# Patient Record
Sex: Female | Born: 1950
Health system: Southern US, Community
[De-identification: ages and names within clinical notes are randomized; demographics above are authoritative.]

## PROBLEM LIST (undated history)

## (undated) DIAGNOSIS — I10 Essential (primary) hypertension: Secondary | ICD-10-CM

## (undated) DIAGNOSIS — M109 Gout, unspecified: Secondary | ICD-10-CM

## (undated) DIAGNOSIS — E785 Hyperlipidemia, unspecified: Secondary | ICD-10-CM

## (undated) DIAGNOSIS — K219 Gastro-esophageal reflux disease without esophagitis: Secondary | ICD-10-CM

## (undated) DIAGNOSIS — J309 Allergic rhinitis, unspecified: Secondary | ICD-10-CM

## (undated) DIAGNOSIS — M1990 Unspecified osteoarthritis, unspecified site: Secondary | ICD-10-CM

## (undated) HISTORY — PX: VAGINAL HYSTERECTOMY: SUR661

## (undated) HISTORY — PX: KNEE SURGERY: SHX244

## (undated) HISTORY — DX: Allergic rhinitis, unspecified: J30.9

## (undated) HISTORY — DX: Hyperlipidemia, unspecified: E78.5

## (undated) HISTORY — DX: Gastro-esophageal reflux disease without esophagitis: K21.9

## (undated) HISTORY — DX: Unspecified osteoarthritis, unspecified site: M19.90

## (undated) HISTORY — DX: Gout, unspecified: M10.9

---

## 1999-06-10 ENCOUNTER — Encounter: Admission: RE | Admit: 1999-06-10 | Discharge: 1999-06-10 | Payer: Self-pay

## 1999-06-10 ENCOUNTER — Encounter: Payer: Self-pay | Admitting: Family Medicine

## 1999-08-27 ENCOUNTER — Ambulatory Visit (HOSPITAL_BASED_OUTPATIENT_CLINIC_OR_DEPARTMENT_OTHER): Admission: RE | Admit: 1999-08-27 | Discharge: 1999-08-27 | Payer: Self-pay | Admitting: Orthopedic Surgery

## 2000-12-22 ENCOUNTER — Other Ambulatory Visit: Admission: RE | Admit: 2000-12-22 | Discharge: 2000-12-22 | Payer: Self-pay | Admitting: Obstetrics and Gynecology

## 2001-06-06 ENCOUNTER — Inpatient Hospital Stay (HOSPITAL_COMMUNITY): Admission: RE | Admit: 2001-06-06 | Discharge: 2001-06-08 | Payer: Self-pay | Admitting: Obstetrics and Gynecology

## 2001-06-07 ENCOUNTER — Encounter: Payer: Self-pay | Admitting: Obstetrics and Gynecology

## 2002-01-07 ENCOUNTER — Encounter (INDEPENDENT_AMBULATORY_CARE_PROVIDER_SITE_OTHER): Payer: Self-pay | Admitting: Internal Medicine

## 2002-01-07 LAB — CONVERTED CEMR LAB

## 2004-02-06 ENCOUNTER — Other Ambulatory Visit: Admission: RE | Admit: 2004-02-06 | Discharge: 2004-02-06 | Payer: Self-pay | Admitting: Obstetrics and Gynecology

## 2004-05-11 ENCOUNTER — Ambulatory Visit: Payer: Self-pay | Admitting: Internal Medicine

## 2004-11-14 ENCOUNTER — Emergency Department: Payer: Self-pay | Admitting: Emergency Medicine

## 2004-12-13 ENCOUNTER — Ambulatory Visit: Payer: Self-pay | Admitting: Family Medicine

## 2005-03-07 ENCOUNTER — Ambulatory Visit: Payer: Self-pay | Admitting: Family Medicine

## 2005-04-06 ENCOUNTER — Ambulatory Visit: Payer: Self-pay | Admitting: Family Medicine

## 2006-01-18 ENCOUNTER — Ambulatory Visit: Payer: Self-pay | Admitting: Family Medicine

## 2006-01-26 ENCOUNTER — Ambulatory Visit: Payer: Self-pay | Admitting: Family Medicine

## 2006-02-02 ENCOUNTER — Ambulatory Visit: Payer: Self-pay | Admitting: Family Medicine

## 2006-12-04 ENCOUNTER — Ambulatory Visit: Payer: Self-pay | Admitting: Family Medicine

## 2006-12-04 DIAGNOSIS — I10 Essential (primary) hypertension: Secondary | ICD-10-CM | POA: Insufficient documentation

## 2006-12-15 ENCOUNTER — Encounter (INDEPENDENT_AMBULATORY_CARE_PROVIDER_SITE_OTHER): Payer: Self-pay | Admitting: Internal Medicine

## 2006-12-15 DIAGNOSIS — E7800 Pure hypercholesterolemia, unspecified: Secondary | ICD-10-CM | POA: Insufficient documentation

## 2007-01-01 ENCOUNTER — Ambulatory Visit: Payer: Self-pay | Admitting: Family Medicine

## 2007-01-01 DIAGNOSIS — J309 Allergic rhinitis, unspecified: Secondary | ICD-10-CM | POA: Insufficient documentation

## 2007-02-13 ENCOUNTER — Telehealth (INDEPENDENT_AMBULATORY_CARE_PROVIDER_SITE_OTHER): Payer: Self-pay | Admitting: *Deleted

## 2007-04-02 ENCOUNTER — Ambulatory Visit: Payer: Self-pay | Admitting: Family Medicine

## 2007-04-02 DIAGNOSIS — K209 Esophagitis, unspecified without bleeding: Secondary | ICD-10-CM | POA: Insufficient documentation

## 2007-04-03 LAB — CONVERTED CEMR LAB
ALT: 24 units/L (ref 0–35)
AST: 19 units/L (ref 0–37)
Albumin: 4.2 g/dL (ref 3.5–5.2)
Alkaline Phosphatase: 80 units/L (ref 39–117)
BUN: 14 mg/dL (ref 6–23)
Basophils Absolute: 0 10*3/uL (ref 0.0–0.1)
Basophils Relative: 0.4 % (ref 0.0–1.0)
Bilirubin, Direct: 0.1 mg/dL (ref 0.0–0.3)
CO2: 29 meq/L (ref 19–32)
Calcium: 9.8 mg/dL (ref 8.4–10.5)
Chloride: 101 meq/L (ref 96–112)
Cholesterol: 232 mg/dL (ref 0–200)
Creatinine, Ser: 0.8 mg/dL (ref 0.4–1.2)
Direct LDL: 158.3 mg/dL
Eosinophils Absolute: 0.1 10*3/uL (ref 0.0–0.6)
Eosinophils Relative: 1 % (ref 0.0–5.0)
GFR calc Af Amer: 96 mL/min
GFR calc non Af Amer: 79 mL/min
Glucose, Bld: 103 mg/dL — ABNORMAL HIGH (ref 70–99)
HCT: 40.1 % (ref 36.0–46.0)
HDL: 52.2 mg/dL (ref >39.0)
Hemoglobin: 13.4 g/dL (ref 12.0–15.0)
Lymphocytes Relative: 28.8 % (ref 12.0–46.0)
MCHC: 33.5 g/dL (ref 30.0–36.0)
MCV: 86.6 fL (ref 78.0–100.0)
Monocytes Absolute: 0.4 10*3/uL (ref 0.2–0.7)
Monocytes Relative: 6.2 % (ref 3.0–11.0)
Neutro Abs: 3.8 10*3/uL (ref 1.4–7.7)
Neutrophils Relative %: 63.6 % (ref 43.0–77.0)
Platelets: 318 10*3/uL (ref 150–400)
Potassium: 3.9 meq/L (ref 3.5–5.1)
RBC: 4.63 M/uL (ref 3.87–5.11)
RDW: 11.7 % (ref 11.5–14.6)
Sodium: 139 meq/L (ref 135–145)
TSH: 1.6 microintl units/mL (ref 0.35–5.50)
Total Bilirubin: 0.7 mg/dL (ref 0.3–1.2)
Total CHOL/HDL Ratio: 4.4
Total Protein: 7.7 g/dL (ref 6.0–8.3)
Triglycerides: 111 mg/dL (ref 0–149)
VLDL: 22 mg/dL (ref 0–40)
WBC: 6 10*3/uL (ref 4.5–10.5)

## 2007-04-11 ENCOUNTER — Telehealth (INDEPENDENT_AMBULATORY_CARE_PROVIDER_SITE_OTHER): Payer: Self-pay | Admitting: Internal Medicine

## 2007-05-29 ENCOUNTER — Ambulatory Visit: Payer: Self-pay | Admitting: Internal Medicine

## 2007-06-01 LAB — CONVERTED CEMR LAB
ALT: 30 units/L (ref 0–35)
AST: 25 units/L (ref 0–37)
Albumin: 4 g/dL (ref 3.5–5.2)
Alkaline Phosphatase: 78 units/L (ref 39–117)
Bilirubin, Direct: 0.1 mg/dL (ref 0.0–0.3)
Cholesterol: 155 mg/dL (ref 0–200)
HDL: 51.4 mg/dL (ref >39.0)
LDL Cholesterol: 82 mg/dL (ref 0–99)
Total Bilirubin: 0.8 mg/dL (ref 0.3–1.2)
Total CHOL/HDL Ratio: 3
Total Protein: 7.5 g/dL (ref 6.0–8.3)
Triglycerides: 110 mg/dL (ref 0–149)
VLDL: 22 mg/dL (ref 0–40)

## 2007-07-03 ENCOUNTER — Encounter (INDEPENDENT_AMBULATORY_CARE_PROVIDER_SITE_OTHER): Payer: Self-pay | Admitting: *Deleted

## 2007-11-28 ENCOUNTER — Ambulatory Visit: Payer: Self-pay | Admitting: Family Medicine

## 2007-11-30 LAB — CONVERTED CEMR LAB
ALT: 20 units/L (ref 0–35)
AST: 15 units/L (ref 0–37)
Cholesterol: 138 mg/dL (ref 0–200)
HDL: 46.7 mg/dL (ref >39.0)
LDL Cholesterol: 72 mg/dL (ref 0–99)
Total CHOL/HDL Ratio: 3
Triglycerides: 98 mg/dL (ref 0–149)
VLDL: 20 mg/dL (ref 0–40)

## 2007-12-19 ENCOUNTER — Ambulatory Visit: Payer: Self-pay | Admitting: Family Medicine

## 2007-12-19 DIAGNOSIS — E669 Obesity, unspecified: Secondary | ICD-10-CM | POA: Insufficient documentation

## 2007-12-19 DIAGNOSIS — K2100 Gastro-esophageal reflux disease with esophagitis, without bleeding: Secondary | ICD-10-CM | POA: Insufficient documentation

## 2007-12-19 DIAGNOSIS — R229 Localized swelling, mass and lump, unspecified: Secondary | ICD-10-CM | POA: Insufficient documentation

## 2008-06-17 ENCOUNTER — Telehealth (INDEPENDENT_AMBULATORY_CARE_PROVIDER_SITE_OTHER): Payer: Self-pay | Admitting: Internal Medicine

## 2008-06-20 ENCOUNTER — Ambulatory Visit: Payer: Self-pay | Admitting: Internal Medicine

## 2008-06-23 LAB — CONVERTED CEMR LAB
ALT: 22 units/L (ref 0–35)
AST: 18 units/L (ref 0–37)
BUN: 13 mg/dL (ref 6–23)
CO2: 32 meq/L (ref 19–32)
Calcium: 9.4 mg/dL (ref 8.4–10.5)
Chloride: 107 meq/L (ref 96–112)
Cholesterol: 137 mg/dL (ref 0–200)
Creatinine, Ser: 0.8 mg/dL (ref 0.4–1.2)
GFR calc non Af Amer: 78.55 mL/min (ref >60)
Glucose, Bld: 96 mg/dL (ref 70–99)
HDL: 46.9 mg/dL (ref >39.00)
LDL Cholesterol: 70 mg/dL (ref 0–99)
Potassium: 3.7 meq/L (ref 3.5–5.1)
Sodium: 143 meq/L (ref 135–145)
Total CHOL/HDL Ratio: 3
Triglycerides: 100 mg/dL (ref 0.0–149.0)
VLDL: 20 mg/dL (ref 0.0–40.0)

## 2008-06-24 ENCOUNTER — Encounter (INDEPENDENT_AMBULATORY_CARE_PROVIDER_SITE_OTHER): Payer: Self-pay | Admitting: Internal Medicine

## 2008-06-24 ENCOUNTER — Ambulatory Visit: Payer: Self-pay | Admitting: Family Medicine

## 2008-06-24 DIAGNOSIS — R079 Chest pain, unspecified: Secondary | ICD-10-CM | POA: Insufficient documentation

## 2008-07-29 ENCOUNTER — Ambulatory Visit: Payer: Self-pay | Admitting: Family Medicine

## 2008-07-29 DIAGNOSIS — K219 Gastro-esophageal reflux disease without esophagitis: Secondary | ICD-10-CM | POA: Insufficient documentation

## 2008-12-22 ENCOUNTER — Ambulatory Visit: Payer: Self-pay | Admitting: Family Medicine

## 2008-12-23 LAB — CONVERTED CEMR LAB
ALT: 21 units/L (ref 0–35)
AST: 19 units/L (ref 0–37)
Cholesterol: 155 mg/dL (ref 0–200)
HDL: 48.9 mg/dL (ref >39.00)
LDL Cholesterol: 84 mg/dL (ref 0–99)
Total CHOL/HDL Ratio: 3
Triglycerides: 110 mg/dL (ref 0.0–149.0)
VLDL: 22 mg/dL (ref 0.0–40.0)

## 2009-01-27 ENCOUNTER — Ambulatory Visit: Payer: Self-pay | Admitting: Family Medicine

## 2009-01-27 DIAGNOSIS — F4321 Adjustment disorder with depressed mood: Secondary | ICD-10-CM | POA: Insufficient documentation

## 2009-06-23 ENCOUNTER — Ambulatory Visit: Payer: Self-pay | Admitting: Family Medicine

## 2009-06-24 LAB — CONVERTED CEMR LAB
ALT: 19 units/L (ref 0–35)
AST: 18 units/L (ref 0–37)
BUN: 14 mg/dL (ref 6–23)
CO2: 30 meq/L (ref 19–32)
Calcium: 9.4 mg/dL (ref 8.4–10.5)
Chloride: 104 meq/L (ref 96–112)
Cholesterol: 148 mg/dL (ref 0–200)
Creatinine, Ser: 0.7 mg/dL (ref 0.4–1.2)
GFR calc non Af Amer: 89.83 mL/min (ref >60)
Glucose, Bld: 92 mg/dL (ref 70–99)
HDL: 51.4 mg/dL (ref >39.00)
LDL Cholesterol: 74 mg/dL (ref 0–99)
Potassium: 4 meq/L (ref 3.5–5.1)
Sodium: 142 meq/L (ref 135–145)
TSH: 1.64 microintl units/mL (ref 0.35–5.50)
Total CHOL/HDL Ratio: 3
Triglycerides: 114 mg/dL (ref 0.0–149.0)
VLDL: 22.8 mg/dL (ref 0.0–40.0)

## 2009-07-21 ENCOUNTER — Ambulatory Visit: Payer: Self-pay | Admitting: Family Medicine

## 2010-03-09 NOTE — Assessment & Plan Note (Signed)
Summary: RE-EST Crystal [facility] FROM [facility]   Vital Signs:  Patient profile:   60 year old female Height:      64.25 inches Weight:      204.50 pounds BMI:     34.96 Temp:     98.2 degrees F oral Pulse rate:   68 / minute Pulse rhythm:   regular BP sitting:   112 / 74  (left arm) Cuff size:   large  Vitals Entered By: Linde Gillis CMA Duncan Dull) (July 21, 2009 8:11 AM) CC: establish care, Crystal [facility]in patient   History of Present Illness: 60 yo new to me here to discuss anxiety.  Her mom has recently become acutely ill.  Has rapidly progressing dementia, recently diagnosed.  Currently in a rehab facility, they are looking for permanent nursing home placement. Feels like she is already grieving the death of her mother eventhough she is still here.  She looks in her eyes and no longer sees her mom. She is having difficultly sleeping, difficulty concentrating.  Last week, she had a panic attack. Worried about her mom, hopes that her mom is not scared. No SI or HI. Has a great support system in her daughter. Her brother and sister have not been too helpful.  Current Medications (verified): 1)  Allegra 180 Mg  Tabs (Fexofenadine Hcl) .... Take 1 Tablet By Mouth Once A Day 2)  Norvasc 5 Mg  Tabs (Amlodipine Besylate) .Marland Kitchen.. 1 Once Daily For Bp 3)  Singulair 10 Mg  Tabs (Montelukast Sodium) .Marland Kitchen.. 1qd As Needed Congeation 4)  Lipitor 20 Mg  Tabs (Atorvastatin Calcium) .Marland Kitchen.. 1 Once Daily For Cholesterol By Mouth 5)  Nexium 40 Mg Cpdr (Esomeprazole Magnesium) .Marland Kitchen.. 1 Each Morning Before Other Fluids or Food, Must Eat or Drink 30-60 Min After Taking 6)  Alprazolam 0.25 Mg Tabs (Alprazolam) .Marland Kitchen.. 1 Tab Three Dailly For Anxiety  Allergies (verified): No Known Drug Allergies  Review of Systems      See HPI Psych:  Complains of anxiety, depression, easily tearful, and panic attacks; denies easily angered, mental problems, sense of great danger, suicidal thoughts/plans, thoughts of violence, unusual  visions or sounds, and thoughts /plans of harming others.  Physical Exam  General:  alert, well-developed, well-nourished, and well-hydrated.  Psych:  normally interactive, flat affect, and subdued.   tearful at times, appropriate.   Impression & Recommendations:  Problem # 1:  DEPRESSION, SITUATIONAL (ICD-309.0) Assessment New Time spent with patient 25 minutes, more than 50% of this time was spent counseling patient on her acute grief reaction.   Feelings are appropriate.  Will give short course of benzo to help with sleep and panic attacks. Also discussed importance of psychotherapy.  Given Dr. Veatrice Kells name.  Complete Medication List: 1)  Allegra 180 Mg Tabs (Fexofenadine hcl) .... Take 1 tablet by mouth once a day 2)  Norvasc 5 Mg Tabs (Amlodipine besylate) .Marland Kitchen.. 1 once daily for bp 3)  Singulair 10 Mg Tabs (Montelukast sodium) .Marland Kitchen.. 1qd as needed congeation 4)  Lipitor 20 Mg Tabs (Atorvastatin calcium) .Marland Kitchen.. 1 once daily for cholesterol by mouth 5)  Nexium 40 Mg Cpdr (Esomeprazole magnesium) .Marland Kitchen.. 1 each morning before other fluids or food, must eat or drink 30-60 min after taking 6)  Alprazolam 0.25 Mg Tabs (Alprazolam) .Marland Kitchen.. 1 tab three dailly for anxiety  Patient Instructions: 1)  It was so nice to meet you, [name]. 2)  Our psychologist's name is Dr. Sherrill Raring. Prescriptions: NEXIUM 40 MG CPDR (ESOMEPRAZOLE  MAGNESIUM) 1 each morning before other fluids or food, must eat or drink 30-60 min after taking  #90 x 3   Entered and Authorized by:   Ruthe Mannan MD   Signed by:   Ruthe Mannan MD on 07/21/2009   Method used:   Electronically to        [facility] [phone]* (retail)       [address] [place], [place]  [address]       Ph: [phone] or [phone]       Fax: [phone]   RxID:   [id number] LIPITOR 20 MG  TABS (ATORVASTATIN CALCIUM) 1 once daily for cholesterol by mouth  #90 x 4   Entered and Authorized by:   Ruthe Mannan  MD   Signed by:   Ruthe Mannan MD on 07/21/2009   Method used:   Electronically to        [facility] [phone]* (retail)       [address] [place], [place]  [address]       Ph: [phone] or [phone]       Fax: [phone]   RxID:   [id number] ALPRAZOLAM 0.25 MG TABS (ALPRAZOLAM) 1 tab three dailly for anxiety  #60 x 0   Entered and Authorized by:   Ruthe Mannan MD   Signed by:   Ruthe Mannan MD on 07/21/2009   Method used:   Print then Give to Patient   RxID:   [id number]   Current Allergies (reviewed today): No known allergies

## 2010-06-23 ENCOUNTER — Encounter: Payer: Self-pay | Admitting: Family Medicine

## 2010-06-23 LAB — HM MAMMOGRAPHY

## 2010-06-24 ENCOUNTER — Encounter: Payer: Self-pay | Admitting: Family Medicine

## 2010-06-24 ENCOUNTER — Ambulatory Visit (INDEPENDENT_AMBULATORY_CARE_PROVIDER_SITE_OTHER): Payer: BC Managed Care – PPO | Admitting: Family Medicine

## 2010-06-24 VITALS — BP 120/80 | HR 88 | Temp 97.8°F | Ht 64.0 in | Wt 207.1 lb

## 2010-06-24 DIAGNOSIS — R42 Dizziness and giddiness: Secondary | ICD-10-CM

## 2010-06-24 MED ORDER — ATORVASTATIN CALCIUM 20 MG PO TABS: 20.0000 mg | ORAL_TABLET | Freq: Every day | ORAL | Status: DC

## 2010-06-24 MED ORDER — MONTELUKAST SODIUM 10 MG PO TABS: 10.0000 mg | ORAL_TABLET | Freq: Every day | ORAL | Status: DC

## 2010-06-24 MED ORDER — MECLIZINE HCL 12.5 MG PO TABS: 12.5000 mg | ORAL_TABLET | Freq: Three times a day (TID) | ORAL | Status: DC | PRN

## 2010-06-24 MED ORDER — AMLODIPINE BESYLATE 5 MG PO TABS: 5.0000 mg | ORAL_TABLET | Freq: Every day | ORAL | Status: DC

## 2010-06-24 MED ORDER — ESOMEPRAZOLE MAGNESIUM 20 MG PO CPDR: 20.0000 mg | DELAYED_RELEASE_CAPSULE | Freq: Every day | ORAL | Status: DC

## 2010-06-24 NOTE — Progress Notes (Signed)
Subjective:     Crystal Hardin is a 60 y.o. female who presents for evaluation of dizziness. The symptoms started 2 days ago and are improved. The attacks occur intermittently and last 2 minutes. Positions that worsen symptoms: rolling in bed to the right and standing up. Previous workup/treatments: none. Associated ear symptoms: none  Associated CNS symptoms: none. Recent infections: none. Head trauma: denied. Drug ingestion: none. Noise exposure: no occupational exposure. Family history: non-contributory.  The PMH, PSH, Social History, Family History, Medications, and allergies have been reviewed in [facility], and have been updated if relevant.   Review of Systems Pertinent items are noted in HPI.    Objective:    BP 120/80  Pulse 88  Temp(Src) 97.8 F (36.6 C) (Oral)  Ht 5\' 4"  (1.626 m)  Wt 207 lb 1.9 oz (93.949 kg)  BMI 35.55 kg/m2  General Appearance:    Alert, cooperative, no distress, appears stated age  Head:    Normocephalic, without obvious abnormality, atraumatic  Eyes:    PERRL, conjunctiva/corneas clear, EOM's intact, fundi    benign, both eyes  Ears:    Normal TM's and external ear canals, both ears  Neurologic:   CNII-XII intact, normal strength, sensation and reflexes    throughout        Assessment:    Benign positional vertigo    Plan:    Meclizine per medication orders.

## 2010-06-25 NOTE — Op Note (Signed)
[facility]  Patient:    Crystal Hardin, Crystal Hardin                       MRN: [id number] Proc. Date: 08/27/99 Adm. Date:  [id number] Disc. Date: [id number] Attending:  Milly Jakob                           Operative Report  PREOPERATIVE DIAGNOSIS:  Medial meniscal tear.  POSTOPERATIVE DIAGNOSES:  Medial meniscal tear.  Medial femoral condylar chondromalacia, grades 2 and 3.  Medial patella facet chondromalacia, grade 2. Probable old anterior cruciate ligament tear.  PROCEDURES PERFORMED:  Partial medial meniscectomy.  Debridement of medial femoral condyle.  Debridement of medial patella facet.  BRIEF HISTORY:  This is a 59 year old female with a long history of having knee pain.  She has been having catching and locking of the knee.  She occasionally had to stand and kind of rotate the knee to get it to unlock. Because of the continued pain she was brought to the operating room for evaluation under anesthesia and arthroscopy.  PROCEDURE IN DETAIL:  The patient was brought to the operating room and under general anesthesia.   She was note to have no significant instability.  She had a good end point to her Lachman, and no significant pivot shift.  She was then prepped and draped in the usual sterile fashion.  Following this, routine arthroscopic examination of the knee revealed there was an obvious medial meniscal tear.  This was debrided with a combination of straight biting forceps and the remainder with the reflexion shaver.  The patient also noted to have fairly significant chondromalacia grades 2-3 of the medial femoral condyle.  This was debrided with the flexion shaver down to a smooth and stable rim.  At this point attention was turned to the anterior cruciate which, in extension, looked great.  In flexion the anterior cruciate did appear to lie down, while lateral did appear to be a little bit empty.  The fibers were loose in flexion, and in  extension they did tighten up and seemed to be functioning reasonably well.  There was a fair amount of synovitis which was debrided to get a good look at the lateral side.  Attention was then turned to the patellofemoral joint and was noted to be fairly significant chondromalacia on the medial patella facet.  This was debrided with the the flexion shaver.   Attention was turned back down into the medial side where a final check was made for any recent fragmented pieces, and it was noted to be normal.  At that point, the knee was copiously irrigated, suctioned dry, and the portals closed with Steri-Strips, and a sterile dressing was applied.  The patient was taken to the recovery room and noted to be in satisfactory condition.  Estimated blood loss for the procedure was minimal. DD:  08/27/99 TD:  08/29/99 Job: [id number]

## 2010-06-25 NOTE — Op Note (Signed)
[facility]  Patient:    Crystal Hardin, Crystal Hardin Visit Number: [id number] MRN: [id number]          Service Type: GYN Location: 9300 [id number] Attending Physician:  Melony Overly Dictated by:   Devoria Albe Edward Jolly, M.D. Proc. Date: 06/06/01 Admit Date:  06/06/2001                             Operative Report  PREOPERATIVE DIAGNOSES:       1. Genuine stress incontinence.                               2. Pelvic organ prolapse.  POSTOPERATIVE DIAGNOSES:      1. Genuine stress incontinence.                               2. Pelvic organ prolapse.                               3. Duplication of left ureter.  OPERATION:                    Abdominal Burch procedure, cystoscopy,                               suprapubic catheter placement, posterior                               colporrhaphy.  SURGEON:                      Brook A. Edward Jolly, M.D.  ASSISTANT:                    Luvenia Redden, M.D.  ANESTHESIA:                   General endotracheal.  IV FLUIDS:                    2000 cc of Ringers lactate.  ESTIMATED BLOOD LOSS:         50 cc.  URINE OUTPUT:                 1600 cc.  COMPLICATIONS:                None.  INDICATIONS FOR PROCEDURE:    The patient is a 60 year old gravida 3, para 2-0-1-2 female, status post total abdominal hysterectomy with unilateral salpingo-oophorectomy in 1995 for endometriosis, who presented to the office with the complaint of worsening urinary incontinence with increases in abdominal pressure.  The patients incontinence was not responsive to _______ therapy, and she wished for surgical evaluation and treatment. Multichannel urodynamic testing documented mild sensory urgency and no evidence of genuine stress incontinence while the patient was in the sitting position.  The patient therefore had retrograde filling of the bladder in the office with 300 cc of sterile water, at which time, she had a positive stress test.  The  patient was diagnosed with a first degree cystocele and a first degree rectocele, and she was noted to have good apical support of the vagina.  The  patient was counseled regarding her options for care and she chose to proceed with surgery after the risks and benefits were reviewed.  FINDINGS:                     The patient was noted to have a first degree cystocele and a first degree rectocele.  The cervix and the uterus were absent.  Cystoscopy demonstrated duplication of the left ureter.  There appeared to be only one right ureter.  All ureters were noted to be patent after intravenous injection of Indigo Carmine.  The bladder was visualized throughout 360 degrees and the trigone and dome were noted to be normal. There were no sutures in the bladder or urethra.  At the end of placement of the Burch suture, the bladder was well-supported.  DESCRIPTION OF PROCEDURE:     With an IV in place, the patient was taken to the operating room after she was properly identified.  The patient received both thigh high TED hose and compression stockings for DVT prophylaxis.  She was treated with Ancef 1 g intravenously for antibiotic prophylaxis.  The patient was placed in a supine position on the operating room table and general endotracheal anesthesia was induced.  The patient was then placed in the dorsolithotomy position, and the abdomen and vagina were sterilely prepped and draped.  A three-way Foley catheter was sterilely placed inside the urinary bladder.  The procedure began with a Pfannenstiel incision along the line of the patients previous incision.  This was performed sharply with a scalpel.  The incision was then carried down to the fascia using monopolar cautery.  The fascia was then incised in the midline with the scalpel and the incision was extended bilaterally using the Mayo scissors.  A turning incision was performed at this point.  The rectus muscles were dissected off of the  fascia inferiorly as well as a portion of the rectus muscles were dissected from the rectus muscles superiorly slightly in order to allow for better access into the retropubic space.  The rectus muscles were then dissected off of their insertion site inferiorly to allow access to the space of Retzius.  The bladder was dissected off of the pubic symphysis and down to the level of the arcus tendineus fasciae pelvis bilaterally using blunt technique.  A hand was then sterilely inserted in the vagina, and a total of four Burch sutures of 0 Ethibond double-armed stitches were placed.  Two sutures were placed on each side.  The sutures were first placed on the patients left hand side by placing a figure-of-eight suture through the paravaginal tissue lateral to and at the level of the mid urethra.  The second Burch suture was then placed lateral to this and at the level of the urethrovesical junction. The same procedure that was performed on the left hand side was then repeated on the right hand side.  All of the Burch sutures were then brought through the Coopers ligament, their appropriate ipsilateral side, and with a hand in the vagina, elevating the paravaginal tissue, the Burch sutures were tied on top of the Coopers ligament for excellent elevation of the urethra and the creation of a hammock of support below the urethra.  Cystoscopy was next performed and the findings are as noted above.  A suprapubic catheter was placed under visualization of the cystoscope.  The patient was placed in the Trendelenburg position for placement of the suprapubic catheter.  Initially, the suprapubic catheter was inserted  through both walls of the bladder and it was therefore retracted so that the tip could be visualized.  It was then inserted slowly into the bladder without difficulty.  The suprapubic catheter was secured to the skin by placing interrupted sutures of 2-0 nylon.  The bladder was drained and  the three-way Foley catheter was replaced.  Closure of the abdominal incision was performed at this time.  Figure-of-eight  sutures of 0 chromic were used to replace the rectus muscles along their tendinous insertion sites.  The fascia was then closed with a running suture of 0 PDS.  The skin was closed with staples and a sterile bandage was later placed.  A posterior colporrhaphy was performed at this time.  The perineum was then grasped with two Allis clamps in the midline and then Allis clamps were used to mark the midline of the vagina up to the top of the level of the rectocele. The perineum and the subvaginal tissues were injected with 0.5% lidocaine with 1:200,000 epinephrine.  A transverse incision was then created along the posterior fourchette with the Mayo scissors, and the Mayo scissors was used to incise the vagina in a vertical fashion up to the level of the highest Allis clamp.  The perirectal fascia was dissected off of the vagina laterally using the Mayo scissors.  Small nonbleeding vessels in the fascia were cauterized with monopolar cautery for excellent hemostasis.  Vertical mattress sutures of 0 Vicryl were used for excellent reduction of the rectocele.  Two crown stitches of 0 Vicryl were also placed to help support the perineal body.  After the excess vaginal tissue was trimmed away, the vagina was closed with a running locked suture of 2-0 Vicryl.  The suture was continued along the perineum as in the standard fashion for an episiotomy.  The knot was then tied inside the hymen.  A rectal exam was performed and there were no sutures in the rectum.  An Iodoform gauze packing was therefore placed in the vagina and the procedure was completed.  The patient was cleansed with Betadine and taken out of the dorsolithotomy position.  She was awakened, extubated, and escorted to the recovery room in stable and awake condition.  There were no complications to the  procedure.  All needle, instrument, and sponge counts were correct. Dictated by:   Devoria Albe Edward Jolly, M.D. Attending Physician:  Melony Overly DD:  06/06/01 TD:  06/06/01 Job: [id number]

## 2010-06-25 NOTE — H&P (Signed)
Crystal [facility]in  Patient:    Crystal Hardin, Crystal Hardin" Visit Number: [id number] MRN: [id number]          Service Type: Attending:  Debbe Bales A. Edward Jolly, M.D. Dictated by:   Devoria Albe Edward Jolly, M.D. Adm. Date:  06/06/01                           History and Physical  H&P to be available June 06, 2001, at 6 a.m.  CHIEF COMPLAINT:              1. Genuine stress incontinence.                               2. Mild pelvic organ prolapse.  HISTORY OF PRESENT ILLNESS:   The patient is a 60 year old gravida 3, para 2-0-1-2, Caucasian female, status post abdominal hysterectomy with unilateral salpingo-oophorectomy in 1995 for endometriosis, who presented to the office for her annual examination in November 2002 with complaint of worsening leakage of urine with increase in abdominal pressure.  The patient reported that kegel exercises were not helpful to treat her symptoms.  The patient wished for surgical treatment of the incontinence as it was becoming a social problem for her.  The patient did undergo multichannel urodynamic testing by Dr. Barron Alvine who diagnosed the patient with mild sensory urgency and no evidence of genuine stress incontinence while the patient was in a sitting position.  The patient had a good detrusor contraction and normal voiding function.  In followup in the office, retrograde bladder filling was performed with 300 cc of sterile water, and the patient did have a positive stress test at that time.  The patient was also diagnosed with a first degree cystocele and a first degree rectocele.  She denied any  problems with fecal incontinence, the use of digital pressure to have a bowel movement, or perineal splinting.  OBSTETRICAL AND GYNECOLOGIC HISTORY:  The patient is status post vaginal delivery x 2.  The patient is currently on Activella hormone replacement therapy for perimenopausal symptoms of hot flashes and night sweats.  The patients last  mammogram was performed on December 22, 2000, and was without evidence of malignancy.  Her Pap smear performed on December 22, 2000, was also within normal limits.  PAST MEDICAL HISTORY: 1. History of hypertension.  The patient is currently not on medication. 2. History of hypercholesterolemia.  The patient is also currently off    medication and has a good cholesterol level. 3. History of peptic ulcer disease. 4. History of colitis. 5. History of arthritis. 6. History of recent influenza A.  PAST SURGICAL HISTORY: 1. Status post total abdominal hysterectomy with unilateral    salpingo-oophorectomy in 1995 at [facility]. 2. Status post knee surgery in 2001 at [facility].  MEDICATIONS:                  1. Allegra 180 mg p.o. q.d.                               2. Activella.  ALLERGIES:                    No known drug allergies.  SOCIAL HISTORY:               The patient is recently  divorced.  She denies the use of tobacco, alcohol, or illicit drugs.  FAMILY HISTORY:               Hypertension: Mother, brother.  Myocardial infarction: Father (deceased).  Breast cancer: Mother.  REVIEW OF SYSTEMS:            Please refer to History of Present Illness.  PHYSICAL EXAMINATION:  VITAL SIGNS:                  Blood pressure 120/78.  GENERAL:                      The patient is a middle-aged Caucasian female in no acute distress.  HEENT:                        Normocephalic, atraumatic.  NECK:                         Negative for adenopathy or thyromegaly.  LUNGS:                        Clear to auscultation bilaterally.  HEART:                        S1, S2 with a regular rate and rhythm.  No evidence of murmur, rub, or gallop.  ABDOMEN:                      Soft and nontender and without evidence of hepatosplenomegaly or organomegaly.  PELVIC:                       Normal external genitalia.  The vagina is without lesions, and the cervix is noted  to be absent.  There is evidence of a first degree cystocele, good apical vaginal support, and a first degree rectocele.  No midline or adnexal masses or tenderness are appreciated.  ASSESSMENT:                   The patient is a 60 year old, gravida 3, para 2-0-1-2 female, status post total abdominal hysterectomy with unilateral salpingo-oophorectomy who has documented genuine stress incontinence and mild pelvic organ prolapse.  The patient desires surgical treatment of the above.  PLAN:                         The patient will undergo an abdominal Burch procedure with cystoscopy and suprapubic catheter placement in addition to a posterior repair at the [facility] on June 06, 2001, at 7:30 a.m.  The patient has agreed to proceed with her surgery after the risks, benefits, and alternatives have been discussed with her. Dictated by:   Devoria Albe Edward Jolly, M.D. Attending:  Devoria Albe. Edward Jolly, M.D. DD:  06/05/01 TD:  06/05/01 Job: [id number]

## 2010-06-25 NOTE — Discharge Summary (Signed)
[facility]  Patient:    Crystal Hardin, Crystal Hardin Visit Number: [id number] MRN: [id number]          Service Type: GYN Location: [id number] Attending Physician:  Melony Overly Dictated by:   Devoria Albe Edward Jolly, M.D. Admit Date:  06/06/2001 Discharge Date: 06/08/2001                             Discharge Summary  ADMISSION DIAGNOSES:          1. Genuine stress incontinence.                               2. Pelvic organ prolapse.  DISCHARGE DIAGNOSES:          1. Status post abdominal Burch procedure,                                  cystoscopy, suprapubic catheter placement,                                  and posterior colporrhaphy.                               2. Duplication of the left ureter.  PROCEDURE:                    The patient underwent an abdominal Burch procedure, cystoscopy, suprapubic catheter placement, and posterior colporrhaphy on June 06, 2001, at [facility] [place] under the direction of [place] A. Edward Jolly, M.D. and with the assistance of W. Lodema Hong, M.D.  HISTORY OF PRESENT ILLNESS:   The patient was a 60 year old, gravida 3, para 2-0-1-2, Caucasian female, status post total abdominal hysterectomy with unilateral salpingo-oophorectomy in 1995, who presented to the office complaining of urinary incontinence with increases in abdominal pressure such as with coughing and sneezing.  The patient wished for surgical treatment of the above.  She did undergo multichannel urodynamic testing which documented mild sensory urgency and no evidence of genuine stress incontinence with the patient in the sitting position.  The patient had a good detrusor contraction and normal voiding function.  Retrograde bladder filling was therefore performed in the office and the patient at this time was noted to have a positive stress test.  The patient was diagnosed with a first degree cystocele and a first degree rectocele.  The patient wished  for surgical evaluation and treatment of the above.  HOSPITAL COURSE:              The patient was therefore admitted to [facility] [place] on June 06, 2001, at which time she underwent an abdominal Burch procedure, cystoscopy, and suprapubic catheter placement along with a posterior colporrhaphy under the direction of Brook A. Edward Jolly, M.D. and with the assistance of W. Lodema Hong, M.D.  The surgery was uncomplicated. Cystoscopy findings at that time documented a duplication of the left ureter and a single right ureter.  All ureters were noted to be patent.  Postoperatively, the patient did have an IVP which confirmed the double collecting symptoms/ureter on the patients left-hand side.  A single ureter on the right was identified.  There was no evidence of  any ureteral obstruction.  On postoperative day #1, the patient began with her bladder training.  At the time of the patients discharge on Jun 08, 2001, she was voiding 100 to 200 cc of urine with postvoid residuals measuring between 150 and 325 cc.  The patient did demonstrate good knowledge of use of the suprapubic catheter.  The patients diet was slowly advanced to normal and she was tolerating this well at the time of her discharge.  The patient was able to ambulate independently.  The patient had her pain controlled initially with a morphine PCA and Toradol.  She was successfully switched over to oral pain medication including Percocet and ibuprofen which worked well.  The patient was found to be in good condition and ready for discharge on postoperative day #2.  Her discharge hematocrit was 31.4%, and she was tolerating this well.  The patient is discharged to home in good condition on Jun 08, 2001.  DISCHARGE INSTRUCTIONS:       The patient will have her staples removed and Steri-Strips and Benzoin placed by nursing staff prior to her discharge. The patient will continue with bladder training at home.  She will  take Percocet one to two p.o. q.4-6h. p.r.n. pain along with ibuprofen 600 mg p.o. q.6h. p.r.n. pain.  The patient will take Colace 100 mg p.o. b.i.d.  The patient will take a multivitamin a day.  The patient will have decreased activity over the next six weeks in which she will not lift anything heavier than 10 pounds for three months.  The patient will call the office in four days to discuss her bladder training results.  The patient will call if she experiences fever, a blocked suprapubic catheter, and difficulty with voiding, increased pain, increased bleeding, nausea and vomiting, incisional drainage or redness, or any other concerns. Dictated by:   Devoria Albe Edward Jolly, M.D. Attending Physician:  Melony Overly DD:  07/01/01 TD:  07/03/01 Job: [id number]

## 2010-07-13 ENCOUNTER — Ambulatory Visit (INDEPENDENT_AMBULATORY_CARE_PROVIDER_SITE_OTHER): Payer: BC Managed Care – PPO | Admitting: Family Medicine

## 2010-07-13 ENCOUNTER — Encounter: Payer: Self-pay | Admitting: Family Medicine

## 2010-07-13 VITALS — BP 124/80 | HR 68 | Temp 98.4°F | Wt 209.0 lb

## 2010-07-13 DIAGNOSIS — M549 Dorsalgia, unspecified: Secondary | ICD-10-CM

## 2010-07-13 DIAGNOSIS — M545 Low back pain, unspecified: Secondary | ICD-10-CM | POA: Insufficient documentation

## 2010-07-13 MED ORDER — IBUPROFEN 600 MG PO TABS: 600.0000 mg | ORAL_TABLET | Freq: Three times a day (TID) | ORAL | Status: AC | PRN

## 2010-07-13 MED ORDER — HYDROCODONE-ACETAMINOPHEN 5-500 MG PO TABS: 1.0000 | ORAL_TABLET | Freq: Three times a day (TID) | ORAL | Status: AC | PRN

## 2010-07-13 NOTE — Progress Notes (Signed)
Back Pain: Location: L lower back, not midline Onset/Timing: ~1 week ago, had been moving some boxes at work a few days before.  Duration:since then Severity/Quality: throbbing and aching in lower back, L buttock, occ radiation into L foot Worse/Better by: worse supine, better standing Associated Symptoms:no weakness  ROS Weight loss: no Fever:no Change in bowel or bladder function:no Loss of strength loss or sensation: no motor change, + transient tingling in L foot,   Meds, vitals, and allergies reviewed.   NAD, uncomfortable due to pain NCAT Neck supple RRR CTAB Back w/o midline pain. Paraspinal muscle tenderness:L lower L spine SLR: pos on L EXT w/o weakness, DTR equal x4, sensation wnl

## 2010-07-13 NOTE — Assessment & Plan Note (Addendum)
With sciatica sx.  D/w pt re: anatomy.  Use vicodin with sedation caution.  Ibuprofen with GI caution.  Stretching handout given.  No need to image.  D/w pt and she understood.  Call back as needed.  Rxs hand written as EMR was down.

## 2010-08-25 ENCOUNTER — Other Ambulatory Visit: Payer: Self-pay | Admitting: *Deleted

## 2010-08-25 MED ORDER — ATORVASTATIN CALCIUM 20 MG PO TABS: 20.0000 mg | ORAL_TABLET | Freq: Every day | ORAL | Status: DC

## 2010-08-26 ENCOUNTER — Other Ambulatory Visit: Payer: Self-pay | Admitting: *Deleted

## 2010-08-26 MED ORDER — ATORVASTATIN CALCIUM 20 MG PO TABS: 20.0000 mg | ORAL_TABLET | Freq: Every day | ORAL | Status: DC

## 2010-08-26 NOTE — Telephone Encounter (Signed)
Patient wanted 90 days supply instead of 30 day

## 2010-09-24 ENCOUNTER — Other Ambulatory Visit: Payer: Self-pay | Admitting: *Deleted

## 2010-09-24 MED ORDER — ESOMEPRAZOLE MAGNESIUM 20 MG PO CPDR: 20.0000 mg | DELAYED_RELEASE_CAPSULE | Freq: Every day | ORAL | Status: DC

## 2010-09-30 ENCOUNTER — Other Ambulatory Visit: Payer: Self-pay | Admitting: *Deleted

## 2010-09-30 MED ORDER — ESOMEPRAZOLE MAGNESIUM 40 MG PO CPDR: 40.0000 mg | DELAYED_RELEASE_CAPSULE | Freq: Every day | ORAL | Status: DC

## 2010-11-22 ENCOUNTER — Other Ambulatory Visit: Payer: Self-pay | Admitting: *Deleted

## 2010-11-22 MED ORDER — MONTELUKAST SODIUM 10 MG PO TABS: 10.0000 mg | ORAL_TABLET | Freq: Every day | ORAL | Status: DC

## 2011-02-17 ENCOUNTER — Ambulatory Visit: Payer: Self-pay | Admitting: Family Medicine

## 2011-02-17 ENCOUNTER — Encounter: Payer: Self-pay | Admitting: Family Medicine

## 2011-02-17 ENCOUNTER — Ambulatory Visit (INDEPENDENT_AMBULATORY_CARE_PROVIDER_SITE_OTHER): Payer: BC Managed Care – PPO | Admitting: Family Medicine

## 2011-02-17 ENCOUNTER — Telehealth: Payer: Self-pay | Admitting: *Deleted

## 2011-02-17 VITALS — BP 118/78 | HR 89 | Temp 98.1°F | Ht 63.5 in | Wt 211.8 lb

## 2011-02-17 DIAGNOSIS — R1904 Left lower quadrant abdominal swelling, mass and lump: Secondary | ICD-10-CM

## 2011-02-17 MED ORDER — ATORVASTATIN CALCIUM 20 MG PO TABS: 20.0000 mg | ORAL_TABLET | Freq: Every day | ORAL | Status: DC

## 2011-02-17 MED ORDER — ESOMEPRAZOLE MAGNESIUM 40 MG PO CPDR: 40.0000 mg | DELAYED_RELEASE_CAPSULE | Freq: Every day | ORAL | Status: DC

## 2011-02-17 MED ORDER — AMLODIPINE BESYLATE 5 MG PO TABS: 5.0000 mg | ORAL_TABLET | Freq: Every day | ORAL | Status: DC

## 2011-02-17 NOTE — Progress Notes (Signed)
  Subjective:    Patient ID: Crystal Hardin, female    DOB: [birth year], 61 y.o.   MRN: [id number]  HPI  61 yo here for lump on her side. She can feel it under her subcutaneous tissue on left side. No painful but "she is aware of it" when she changes position or rides in the car. She does not think it has grown in size. First noticed it a few weeks before Christmas.  No dysuria, nausea, vomiting, changes in her bowels.  Never had anything like this before.  Patient Active Problem List  Diagnoses  . HYPERCHOLESTEROLEMIA  . OBESITY  . DEPRESSION, SITUATIONAL  . HYPERTENSION, ESSENTIAL NOS  . RHINITIS  . GERD  . ESOPHAGITIS, REFLUX  . GERD  . MASS, LEFT AXILLA  . CHEST PAIN  . Back pain  . Left lower quadrant abdominal swelling of mass or lump   No past medical history on file. Past Surgical History  Procedure Date  . Vaginal hysterectomy     left ovary intact, DUB   History  Substance Use Topics  . Smoking status: Never Smoker   . Smokeless tobacco: Never Used  . Alcohol Use: Yes     rarely   Family History  Problem Relation Age of Onset  . Hyperlipidemia Mother   . Hypertension Mother   . Cancer Mother     breast  . Arthritis Mother   . Heart disease Father   . Emphysema Father   . Hypertension Sister   . Hypertension Brother   . Dementia Maternal Aunt     Alzheimers  . Dementia Maternal Aunt     Alzheimers   No Known Allergies Current Outpatient Prescriptions on File Prior to Visit  Medication Sig Dispense Refill  . montelukast (SINGULAIR) 10 MG tablet Take 1 tablet (10 mg total) by mouth daily.  30 tablet  6   The PMH, PSH, Social History, Family History, Medications, and allergies have been reviewed in [facility], and have been updated if relevant.    Review of Systems    See HPI Objective:   Physical Exam BP 118/78  Pulse 89  Temp(Src) 98.1 F (36.7 C) (Oral)  Ht 5' 3.5" (1.613 m)  Wt 211 lb 12.8 oz (96.072 kg)  BMI 36.93 kg/m2  SpO2  99%  General:  Well-developed,well-nourished,in no acute distress; alert,appropriate and cooperative throughout examination Head:  normocephalic and atraumatic.   Abdomen:  Bowel sounds positive,abdomen soft and non-tender without masses, organomegaly or hernias noted. Msk:  No deformity or scoliosis noted of thoracic or lumbar spine.   Extremities:  No clubbing, cyanosis, edema, or deformity noted with normal full range of motion of all joints.   Neurologic:  alert & oriented X3 and gait normal.   Skin:  Intact without suspicious lesions or rashes Psych:  Cognition and judgment appear intact. Alert and cooperative with normal attention span and concentration. No apparent delusions, illusions, hallucinations    Assessment & Plan:   1. Left lower quadrant abdominal swelling of mass or lump  US Abdomen Complete, [place] Misc Soft Tissue   New.  I was unable to appreciate a mass. Will get an ultrasound of soft tissue to evaluate further. The patient indicates understanding of these issues and agrees with the plan.

## 2011-02-17 NOTE — Telephone Encounter (Signed)
Call Report: Ultrasound: No evidence of mass in Lower Left Quadrant, no evidence of hernia.  Patient was notified via Selena Batten and was sent home.

## 2011-02-17 NOTE — Patient Instructions (Signed)
Good to see you. Please stop by to see Shirlee Limerick on your way out. When we get your ultrasound results, we will call you.

## 2011-02-17 NOTE — Telephone Encounter (Signed)
Noted! Thank you

## 2011-06-30 ENCOUNTER — Other Ambulatory Visit: Payer: Self-pay

## 2011-06-30 MED ORDER — MONTELUKAST SODIUM 10 MG PO TABS: 10.0000 mg | ORAL_TABLET | Freq: Every day | ORAL | Status: DC

## 2011-06-30 NOTE — Telephone Encounter (Signed)
Pt request refill to [facility] Sing[facility]air #30 x 6.Patient notified as instructed by telephone refill done.

## 2011-07-10 ENCOUNTER — Other Ambulatory Visit: Payer: Self-pay | Admitting: Family Medicine

## 2011-08-22 ENCOUNTER — Emergency Department: Payer: Self-pay | Admitting: Emergency Medicine

## 2011-08-22 LAB — COMPREHENSIVE METABOLIC PANEL
Albumin: 4.2 g/dL (ref 3.4–5.0)
Alkaline Phosphatase: 102 U/L (ref 50–136)
BUN: 11 mg/dL (ref 7–18)
Creatinine: 0.85 mg/dL (ref 0.60–1.30)
EGFR (African American): >60
EGFR (Non-African Amer.): >60
Glucose: 101 mg/dL — ABNORMAL HIGH (ref 65–99)
Osmolality: 279 (ref 275–301)
Potassium: 4.1 mmol/L (ref 3.5–5.1)
SGOT(AST): 23 U/L (ref 15–37)
SGPT (ALT): 26 U/L
Sodium: 140 mmol/L (ref 136–145)
Total Protein: 7.9 g/dL (ref 6.4–8.2)

## 2011-08-22 LAB — CBC
HCT: 39.7 %
HGB: 13.1 g/dL
MCH: 28.5 pg
MCHC: 32.9 g/dL
MCV: 87 fL
Platelet: 316 x10 3/mm 3
RBC: 4.58 X10 6/mm 3
RDW: 12.3 %
WBC: 8.9 x10 3/mm 3

## 2011-08-22 LAB — URINALYSIS, COMPLETE
Bacteria: NONE SEEN
Bilirubin,UR: NEGATIVE
Glucose,UR: NEGATIVE mg/dL
Ketone: NEGATIVE
Nitrite: NEGATIVE
Ph: 7
Protein: NEGATIVE
RBC,UR: 1 /HPF
Specific Gravity: 1.006
Squamous Epithelial: 3
WBC UR: 1 /HPF

## 2011-08-23 ENCOUNTER — Ambulatory Visit: Payer: BC Managed Care – PPO | Admitting: Family Medicine

## 2011-12-26 ENCOUNTER — Other Ambulatory Visit: Payer: Self-pay | Admitting: Family Medicine

## 2012-01-29 ENCOUNTER — Other Ambulatory Visit: Payer: Self-pay | Admitting: Family Medicine

## 2012-02-02 ENCOUNTER — Other Ambulatory Visit: Payer: Self-pay | Admitting: *Deleted

## 2012-02-02 MED ORDER — MONTELUKAST SODIUM 10 MG PO TABS: 10.0000 mg | ORAL_TABLET | Freq: Every day | ORAL | Status: DC

## 2012-03-27 ENCOUNTER — Other Ambulatory Visit: Payer: Self-pay | Admitting: Family Medicine

## 2012-04-30 ENCOUNTER — Other Ambulatory Visit: Payer: Self-pay | Admitting: Family Medicine

## 2012-05-01 ENCOUNTER — Other Ambulatory Visit: Payer: Self-pay | Admitting: *Deleted

## 2012-05-01 MED ORDER — MONTELUKAST SODIUM 10 MG PO TABS: 10.0000 mg | ORAL_TABLET | Freq: Every day | ORAL | Status: DC

## 2012-06-05 ENCOUNTER — Ambulatory Visit (INDEPENDENT_AMBULATORY_CARE_PROVIDER_SITE_OTHER): Payer: BC Managed Care – HMO | Admitting: Family Medicine

## 2012-06-05 ENCOUNTER — Encounter: Payer: Self-pay | Admitting: *Deleted

## 2012-06-05 ENCOUNTER — Encounter: Payer: Self-pay | Admitting: Family Medicine

## 2012-06-05 VITALS — BP 140/82 | HR 60 | Temp 98.1°F | Wt 199.0 lb

## 2012-06-05 DIAGNOSIS — I10 Essential (primary) hypertension: Secondary | ICD-10-CM

## 2012-06-05 DIAGNOSIS — K209 Esophagitis, unspecified without bleeding: Secondary | ICD-10-CM

## 2012-06-05 DIAGNOSIS — E7800 Pure hypercholesterolemia, unspecified: Secondary | ICD-10-CM

## 2012-06-05 LAB — LIPID PANEL
Cholesterol: 128 mg/dL (ref 0–200)
LDL Cholesterol: 60 mg/dL (ref 0–99)
Total CHOL/HDL Ratio: 2
Triglycerides: 80 mg/dL (ref 0.0–149.0)
VLDL: 16 mg/dL (ref 0.0–40.0)

## 2012-06-05 LAB — COMPREHENSIVE METABOLIC PANEL
ALT: 19 U/L (ref 0–35)
AST: 16 U/L (ref 0–37)
Albumin: 4 g/dL (ref 3.5–5.2)
Calcium: 9.1 mg/dL (ref 8.4–10.5)
Chloride: 105 mEq/L (ref 96–112)
Potassium: 3.5 mEq/L (ref 3.5–5.1)
Sodium: 141 mEq/L (ref 135–145)

## 2012-06-05 MED ORDER — ESOMEPRAZOLE MAGNESIUM 40 MG PO CPDR: DELAYED_RELEASE_CAPSULE | ORAL | Status: DC

## 2012-06-05 MED ORDER — ATORVASTATIN CALCIUM 20 MG PO TABS: ORAL_TABLET | ORAL | Status: DC

## 2012-06-05 MED ORDER — MONTELUKAST SODIUM 10 MG PO TABS: 10.0000 mg | ORAL_TABLET | Freq: Every day | ORAL | Status: DC

## 2012-06-05 MED ORDER — AMLODIPINE BESYLATE 5 MG PO TABS: ORAL_TABLET | ORAL | Status: DC

## 2012-06-05 NOTE — Progress Notes (Signed)
62 yo who I have not seen for routine care since 2011 here for "med refills."  Doing very well. Recently got back from RV trip with her son.  HTN- takes amlodipine 5 mg daily. No HA, blurred vision, CP or LE edema. Lab Results  Component Value Date   CREATININE 0.7 06/23/2009    HLD- taking Lipitor 20 mg daily.  She would like to come off her medication.  She is exercising eating better.  Has lost 12 pounds since January! Does have family h/o CAD- dad died of MI at 79. She is not a smoker.  She is unaware of family h/o HLD.  Lab Results  Component Value Date   CHOL 148 06/23/2009   HDL 51.40 06/23/2009   LDLCALC 74 06/23/2009   LDLDIRECT 158.3 04/02/2007   TRIG 114.0 06/23/2009   CHOLHDL 3 06/23/2009   Wt Readings from Last 3 Encounters:  06/05/12 199 lb (90.266 kg)  02/17/11 211 lb 12.8 oz (96.072 kg)  07/13/10 209 lb (94.802 kg)   GERD- improved with weight loss.  Patient Active Problem List   Diagnosis Date Noted  . DEPRESSION, SITUATIONAL 01/27/2009  . OBESITY 12/19/2007  . ESOPHAGITIS, REFLUX 12/19/2007  . RHINITIS 01/01/2007  . HYPERCHOLESTEROLEMIA 12/15/2006  . HYPERTENSION, ESSENTIAL NOS 12/04/2006   No past medical history on file. Past Surgical History  Procedure Laterality Date  . Vaginal hysterectomy      left ovary intact, DUB   History  Substance Use Topics  . Smoking status: Never Smoker   . Smokeless tobacco: Never Used  . Alcohol Use: Yes     Comment: rarely   Family History  Problem Relation Age of Onset  . Hyperlipidemia Mother   . Hypertension Mother   . Cancer Mother     breast  . Arthritis Mother   . Heart disease Father   . Emphysema Father   . Hypertension Sister   . Hypertension Brother   . Dementia Maternal Aunt     Alzheimers  . Dementia Maternal Aunt     Alzheimers   No Known Allergies No current outpatient prescriptions on file prior to visit.   No current facility-administered medications on file prior to visit.   The  PMH, PSH, Social History, Family History, Medications, and allergies have been reviewed in [facility], and have been updated if relevant.  ROS: See HPI  Physical exam: BP 140/82  Pulse 60  Temp(Src) 98.1 F (36.7 C)  Wt 199 lb (90.266 kg)  BMI 34.69 kg/m2  General:  Well-developed,well-nourished,in no acute distress; alert,appropriate and cooperative throughout examination Head:  normocephalic and atraumatic.   Eyes:  vision grossly intact, pupils equal, pupils round, and pupils reactive to light.   Ears:  R ear normal and L ear normal.   Nose:  no external deformity.   Mouth:  good dentition.   Neck:  No deformities, masses, or tenderness noted. Lungs:  Normal respiratory effort, chest expands symmetrically. Lungs are clear to auscultation, no crackles or wheezes. Heart:  Normal rate and regular rhythm. S1 and S2 normal without gallop, murmur, click, rub or other extra sounds. Msk:  No deformity or scoliosis noted of thoracic or lumbar spine.   Extremities:  No clubbing, cyanosis, edema, or deformity noted with normal full range of motion of all joints.   Neurologic:  alert & oriented X3 and gait normal.   Skin:  Intact without suspicious lesions or rashes Psych:  Cognition and judgment appear intact. Alert and cooperative with normal  attention span and concentration. No apparent delusions, illusions, hallucinations  Assessment and Plan: 1. HYPERCHOLESTEROLEMIA She has improved her life style and would like to stop taking simvastatin.  Overdue for labs- will check today.  Give lifestyle changes and weight loss, will likely at least be able to decrease dose.   - Lipid Panel  2. HYPERTENSION, ESSENTIAL NOS Stable on low dose amlodipine. - Comprehensive metabolic panel  3. Esophagitis Quiet.  Does take Nexium as needed.

## 2012-06-05 NOTE — Patient Instructions (Addendum)
Good to see you. I will call you with your lab results.  We may be able to decrease or stop your cholesterol medication.

## 2012-06-06 ENCOUNTER — Telehealth: Payer: Self-pay

## 2012-06-06 NOTE — Telephone Encounter (Signed)
Pt called for lab results and pt said had discussed with Dr Dayton Martes about stopping Lipitor if labs good. Pt wants to know if can stop Lipitor.Please advise. [facility].

## 2012-06-06 NOTE — Telephone Encounter (Signed)
Advised patient ok to stop lipitor, lab appt scheduled.

## 2012-06-06 NOTE — Telephone Encounter (Signed)
Yes ok to d/c lipitor.  Let's recheck lipid panel and CMET in 2 months. (272.4).

## 2012-08-06 ENCOUNTER — Ambulatory Visit (INDEPENDENT_AMBULATORY_CARE_PROVIDER_SITE_OTHER): Payer: BC Managed Care – HMO | Admitting: Family Medicine

## 2012-08-06 ENCOUNTER — Encounter: Payer: Self-pay | Admitting: Family Medicine

## 2012-08-06 VITALS — BP 122/78 | HR 76 | Temp 97.8°F | Wt 200.0 lb

## 2012-08-06 DIAGNOSIS — R079 Chest pain, unspecified: Secondary | ICD-10-CM

## 2012-08-06 DIAGNOSIS — R1013 Epigastric pain: Secondary | ICD-10-CM

## 2012-08-06 LAB — CBC
HCT: 37.8 % (ref 36.0–46.0)
MCHC: 33.8 g/dL (ref 30.0–36.0)
MCV: 87.7 fl (ref 78.0–100.0)
RBC: 4.32 Mil/uL (ref 3.87–5.11)
RDW: 12.4 % (ref 11.5–14.6)

## 2012-08-06 LAB — COMPREHENSIVE METABOLIC PANEL
ALT: 18 U/L (ref 0–35)
AST: 17 U/L (ref 0–37)
Albumin: 4.3 g/dL (ref 3.5–5.2)
Alkaline Phosphatase: 71 U/L (ref 39–117)
Potassium: 3.5 mEq/L (ref 3.5–5.1)
Sodium: 138 mEq/L (ref 135–145)
Total Bilirubin: 0.5 mg/dL (ref 0.3–1.2)
Total Protein: 7.7 g/dL (ref 6.0–8.3)

## 2012-08-06 NOTE — Patient Instructions (Addendum)
Good to see you. Please stop by to see Shirlee Limerick on your way out to set up your referral.  We will call you with your lab work.  If your symptoms if your worsens, please go to the ER.

## 2012-08-06 NOTE — Progress Notes (Signed)
  Subjective:    Patient ID: Crystal Hardin, female    DOB: [birth year], 62 y.o.   MRN: [id number]  HPI  62 yo here for acute onset of left epigastric pain that radiates to left scapula.  Started to two days ago, getting progressively worse.  Cannot get comfortable. Worsened by eating and drinking.  Mild nausea, no vomiting.  Nexium not helping.  No SOB except when pain is sharp it "takes her breath away."  Does still have a gall bladder.  Patient Active Problem List   Diagnosis Date Noted  . DEPRESSION, SITUATIONAL 01/27/2009  . OBESITY 12/19/2007  . ESOPHAGITIS, REFLUX 12/19/2007  . RHINITIS 01/01/2007  . HYPERCHOLESTEROLEMIA 12/15/2006  . HYPERTENSION, ESSENTIAL NOS 12/04/2006   No past medical history on file. Past Surgical History  Procedure Laterality Date  . Vaginal hysterectomy      left ovary intact, DUB   History  Substance Use Topics  . Smoking status: Never Smoker   . Smokeless tobacco: Never Used  . Alcohol Use: Yes     Comment: rarely   Family History  Problem Relation Age of Onset  . Hyperlipidemia Mother   . Hypertension Mother   . Cancer Mother     breast  . Arthritis Mother   . Heart disease Father   . Emphysema Father   . Hypertension Sister   . Hypertension Brother   . Dementia Maternal Aunt     Alzheimers  . Dementia Maternal Aunt     Alzheimers   No Known Allergies Current Outpatient Prescriptions on File Prior to Visit  Medication Sig Dispense Refill  . amLODipine (NORVASC) 5 MG tablet TAKE 1 TABLET (5 MG TOTAL) BY MOUTH DAILY.  90 tablet  1  . esomeprazole (NEXIUM) 40 MG capsule TAKE 1 CAPSULE (40 MG TOTAL) BY MOUTH DAILY BEFORE BREAKFAST.  90 capsule  1  . montelukast (SINGULAIR) 10 MG tablet Take 1 tablet (10 mg total) by mouth daily.  90 tablet  1   No current facility-administered medications on file prior to visit.   The PMH, PSH, Social History, Family History, Medications, and allergies have been reviewed in [facility], and have  been updated if relevant.   Review of Systems    No fevers. Bowels normal. Objective:   Physical Exam  Constitutional: She appears well-developed and well-nourished.  Does appear uncomfortable- cannot find a good position while seated.  Cardiovascular: Normal rate and regular rhythm.   Abdominal: Soft. Normal appearance. She exhibits no distension. There is tenderness in the epigastric area and left upper quadrant. There is no rigidity and no guarding.  Skin: Skin is warm, dry and intact.  Psychiatric: She has a normal mood and affect. Her speech is normal and behavior is normal. Judgment and thought content normal. Cognition and memory are normal.   BP 122/78  Pulse 76  Temp(Src) 97.8 F (36.6 C)  Wt 200 lb (90.719 kg)  BMI 34.87 kg/m2     Assessment & Plan:  1. Chest pain EKG reassuring- NSR.  See below. - EKG 12-Lead  2. Epigastric pain New- non surgical abdomen but appears uncomfortable. Differential is wide- ?PUD, pancreatitis. Will check labs, abdominal US. May need abdominal CT. Pt aware to go to ER if symptoms worsen. - US Abdomen Complete; Future - Lipase - CBC - Comprehensive metabolic panel

## 2012-08-07 ENCOUNTER — Encounter: Payer: Self-pay | Admitting: Family Medicine

## 2012-08-07 ENCOUNTER — Ambulatory Visit: Payer: Self-pay | Admitting: Family Medicine

## 2012-08-08 ENCOUNTER — Telehealth: Payer: Self-pay

## 2012-08-08 ENCOUNTER — Encounter: Payer: Self-pay | Admitting: Family Medicine

## 2012-08-08 NOTE — Telephone Encounter (Signed)
Pt left v/m when results of US done on 08/07/12 pt request cb.

## 2012-08-08 NOTE — Telephone Encounter (Signed)
Advised pt results were ok, she says she feels much better but is asking if a hernia would show on ultrasound, I told her it would. She said she would call back it symptoms flare up again.

## 2012-08-09 ENCOUNTER — Other Ambulatory Visit (INDEPENDENT_AMBULATORY_CARE_PROVIDER_SITE_OTHER): Payer: BC Managed Care – HMO

## 2012-08-09 DIAGNOSIS — E7800 Pure hypercholesterolemia, unspecified: Secondary | ICD-10-CM

## 2012-08-09 DIAGNOSIS — I10 Essential (primary) hypertension: Secondary | ICD-10-CM

## 2012-08-09 LAB — COMPREHENSIVE METABOLIC PANEL
ALT: 17 U/L (ref 0–35)
AST: 15 U/L (ref 0–37)
Albumin: 4 g/dL (ref 3.5–5.2)
CO2: 32 mEq/L (ref 19–32)
Calcium: 9.3 mg/dL (ref 8.4–10.5)
Chloride: 104 mEq/L (ref 96–112)
GFR: 77.44 mL/min (ref >60.00)
Potassium: 3.6 mEq/L (ref 3.5–5.1)
Total Protein: 7.5 g/dL (ref 6.0–8.3)

## 2012-08-09 LAB — LIPID PANEL
Total CHOL/HDL Ratio: 5
VLDL: 24.4 mg/dL (ref 0.0–40.0)

## 2012-08-14 ENCOUNTER — Encounter: Payer: Self-pay | Admitting: Family Medicine

## 2012-09-08 ENCOUNTER — Other Ambulatory Visit: Payer: Self-pay | Admitting: Family Medicine

## 2012-09-10 NOTE — Telephone Encounter (Signed)
Dr. Dayton Martes, refill request for lipitor.  There is a phone note from last month where pt asked if she could d/c lipitor, and was told that she could.  After she had labs and you saw results you sent her an email asking if she wanted to restart simvastatin.  Is pt to be taking simvastatin instead of lipitor?

## 2012-11-27 ENCOUNTER — Other Ambulatory Visit: Payer: Self-pay | Admitting: Family Medicine

## 2012-11-29 ENCOUNTER — Telehealth: Payer: Self-pay | Admitting: *Deleted

## 2012-11-29 NOTE — Telephone Encounter (Deleted)
Received fax from pharmacy that Cialis 20 mg requires prior auth on 11/25/12. Prior Berkley Harvey was approved on 11/29/12, awaiting confirmation paperwork from pharmacy. Confirmation will be scanned upon arrival. Pharmacy notified of approval.

## 2012-11-30 NOTE — Telephone Encounter (Signed)
Received prior auth request for nexium, paperwork received and pending completion

## 2012-12-13 ENCOUNTER — Other Ambulatory Visit: Payer: Self-pay

## 2012-12-20 NOTE — Telephone Encounter (Signed)
As of 12/19/12 had not heard from ins company re: prior [place]. I called ins company and was informed that Nexium had been denied. Rabeprazole 20 mg, lansoprazole 30 mg, and pantoprazole 40 mg are approved and all have to have been tried and failed for Nexium to be covered. Pt has only tried and failed Prilosec and ranitidine. Can any of the approved generics be prescribed?

## 2012-12-20 NOTE — Telephone Encounter (Signed)
pantoprazole 40 mg daily # 30, 2 refills

## 2013-02-24 ENCOUNTER — Other Ambulatory Visit: Payer: Self-pay | Admitting: Family Medicine

## 2013-03-02 ENCOUNTER — Other Ambulatory Visit: Payer: Self-pay | Admitting: Family Medicine

## 2013-03-04 ENCOUNTER — Encounter: Payer: Self-pay | Admitting: Family Medicine

## 2013-03-04 ENCOUNTER — Ambulatory Visit (INDEPENDENT_AMBULATORY_CARE_PROVIDER_SITE_OTHER): Payer: BC Managed Care – HMO | Admitting: Family Medicine

## 2013-03-04 VITALS — BP 120/80 | HR 80 | Temp 97.8°F | Wt 210.5 lb

## 2013-03-04 DIAGNOSIS — T7840XA Allergy, unspecified, initial encounter: Secondary | ICD-10-CM

## 2013-03-04 MED ORDER — DEXAMETHASONE SODIUM PHOSPHATE 10 MG/ML IJ SOLN
10.0000 mg | Freq: Once | INTRAMUSCULAR | Status: AC
  Administered 2013-03-04: 10 mg via INTRAVENOUS

## 2013-03-04 NOTE — Patient Instructions (Signed)
Great to see you. Please take Benadryl or zyrtec for next several days as directed on box.  Call me on Wednesday if no better.  Think about going to see an allergist.

## 2013-03-04 NOTE — Assessment & Plan Note (Signed)
IM decadron. Antihistamines until symptoms resolved. She will call in two days, if symptoms persist, start po prednisone. She declined referral to an allergist.

## 2013-03-04 NOTE — Progress Notes (Signed)
Subjective:    Patient ID: Crystal Hardin, female    DOB: August 11, 1950, 63 y.o.   MRN: [id number]  HPI  Very pleasant 64 female here for very itchy rash "all over her body."  Started on Sunday morning.  Woke up with itchy rash on chest and eye were swollen.  No new foods or changes in soaps or detergents.  Has had episode like this twice in past.  Unclear what her triggers are.  No wheezing or SOB.  Patient Active Problem List   Diagnosis Date Noted  . Allergic reaction 03/04/2013  . DEPRESSION, SITUATIONAL 01/27/2009  . OBESITY 12/19/2007  . ESOPHAGITIS, REFLUX 12/19/2007  . RHINITIS 01/01/2007  . HYPERCHOLESTEROLEMIA 12/15/2006  . HYPERTENSION, ESSENTIAL NOS 12/04/2006   No past medical history on file. Past Surgical History  Procedure Laterality Date  . Vaginal hysterectomy      left ovary intact, DUB   History  Substance Use Topics  . Smoking status: Never Smoker   . Smokeless tobacco: Never Used  . Alcohol Use: Yes     Comment: rarely   Family History  Problem Relation Age of Onset  . Hyperlipidemia Mother   . Hypertension Mother   . Cancer Mother     breast  . Arthritis Mother   . Heart disease Father   . Emphysema Father   . Hypertension Sister   . Hypertension Brother   . Dementia Maternal Aunt     Alzheimers  . Dementia Maternal Aunt     Alzheimers   No Known Allergies Current Outpatient Prescriptions on File Prior to Visit  Medication Sig Dispense Refill  . amLODipine (NORVASC) 5 MG tablet TAKE 1 TABLET (5 MG TOTAL) BY MOUTH DAILY.  90 tablet  1  . atorvastatin (LIPITOR) 20 MG tablet TAKE ONE TABLET BY MOUTH DAILY.  90 tablet  1  . montelukast (SINGULAIR) 10 MG tablet TAKE 1 TABLET (10 MG TOTAL) BY MOUTH DAILY.  90 tablet  1  . NEXIUM 40 MG capsule TAKE 1 CAPSULE (40 MG TOTAL) BY MOUTH DAILY BEFORE BREAKFAST.  90 capsule  1   No current facility-administered medications on file prior to visit.   Patient Active Problem List   Diagnosis Date  Noted  . Allergic reaction 03/04/2013  . DEPRESSION, SITUATIONAL 01/27/2009  . OBESITY 12/19/2007  . ESOPHAGITIS, REFLUX 12/19/2007  . RHINITIS 01/01/2007  . HYPERCHOLESTEROLEMIA 12/15/2006  . HYPERTENSION, ESSENTIAL NOS 12/04/2006   No past medical history on file. Past Surgical History  Procedure Laterality Date  . Vaginal hysterectomy      left ovary intact, DUB   History  Substance Use Topics  . Smoking status: Never Smoker   . Smokeless tobacco: Never Used  . Alcohol Use: Yes     Comment: rarely   Family History  Problem Relation Age of Onset  . Hyperlipidemia Mother   . Hypertension Mother   . Cancer Mother     breast  . Arthritis Mother   . Heart disease Father   . Emphysema Father   . Hypertension Sister   . Hypertension Brother   . Dementia Maternal Aunt     Alzheimers  . Dementia Maternal Aunt     Alzheimers   No Known Allergies Current Outpatient Prescriptions on File Prior to Visit  Medication Sig Dispense Refill  . amLODipine (NORVASC) 5 MG tablet TAKE 1 TABLET (5 MG TOTAL) BY MOUTH DAILY.  90 tablet  1  . atorvastatin (LIPITOR) 20 MG tablet TAKE ONE  TABLET BY MOUTH DAILY.  90 tablet  1  . montelukast (SINGULAIR) 10 MG tablet TAKE 1 TABLET (10 MG TOTAL) BY MOUTH DAILY.  90 tablet  1  . NEXIUM 40 MG capsule TAKE 1 CAPSULE (40 MG TOTAL) BY MOUTH DAILY BEFORE BREAKFAST.  90 capsule  1   No current facility-administered medications on file prior to visit.   The PMH, PSH, Social History, Family History, Medications, and allergies [facility], and have been updated if relevant.   Review of Systems See HPI No swelling of throat or difficulty breathing    Objective:   Physical Exam  BP 120/80  Pulse 80  Temp(Src) 97.8 F (36.6 C) (Oral)  Wt 210 lb 8 oz (95.482 kg)  SpO2 97% Gen:  Alert, pleasant, NAD HEENT:  Supple, conjuctiva clear Resp:  Clear without wheezes Skin:  Diffuse urticaria, eye lids swollen    Assessment & Plan:

## 2013-03-04 NOTE — Progress Notes (Signed)
Pre-visit discussion using our clinic review tool. No additional management support is needed unless otherwise documented below in the visit note.  

## 2013-05-23 ENCOUNTER — Other Ambulatory Visit: Payer: Self-pay | Admitting: Family Medicine

## 2013-06-29 ENCOUNTER — Other Ambulatory Visit: Payer: Self-pay | Admitting: Family Medicine

## 2013-07-04 ENCOUNTER — Other Ambulatory Visit: Payer: Self-pay

## 2013-07-04 NOTE — Telephone Encounter (Signed)
Pt called and wanted to know why amlodipine and singulair was only for # 30. Advised should have been note with rx need appt. Pt will schedule appt to come in before med is out.

## 2013-07-18 ENCOUNTER — Ambulatory Visit (INDEPENDENT_AMBULATORY_CARE_PROVIDER_SITE_OTHER): Payer: BC Managed Care – HMO | Admitting: Family Medicine

## 2013-07-18 ENCOUNTER — Encounter: Payer: Self-pay | Admitting: Family Medicine

## 2013-07-18 ENCOUNTER — Telehealth: Payer: Self-pay | Admitting: Family Medicine

## 2013-07-18 VITALS — BP 118/70 | HR 75 | Temp 97.7°F | Ht 63.5 in | Wt 209.5 lb

## 2013-07-18 DIAGNOSIS — I10 Essential (primary) hypertension: Secondary | ICD-10-CM

## 2013-07-18 DIAGNOSIS — K2100 Gastro-esophageal reflux disease with esophagitis, without bleeding: Secondary | ICD-10-CM

## 2013-07-18 DIAGNOSIS — F4321 Adjustment disorder with depressed mood: Secondary | ICD-10-CM

## 2013-07-18 DIAGNOSIS — E7800 Pure hypercholesterolemia, unspecified: Secondary | ICD-10-CM

## 2013-07-18 DIAGNOSIS — M2550 Pain in unspecified joint: Secondary | ICD-10-CM | POA: Insufficient documentation

## 2013-07-18 LAB — COMPREHENSIVE METABOLIC PANEL
ALK PHOS: 65 U/L (ref 39–117)
ALT: 19 U/L (ref 0–35)
AST: 18 U/L (ref 0–37)
Albumin: 4.1 g/dL (ref 3.5–5.2)
BUN: 13 mg/dL (ref 6–23)
CHLORIDE: 103 meq/L (ref 96–112)
CO2: 30 mEq/L (ref 19–32)
CREATININE: 0.7 mg/dL (ref 0.4–1.2)
Calcium: 9.4 mg/dL (ref 8.4–10.5)
GFR: 91.57 mL/min (ref >60.00)
Glucose, Bld: 107 mg/dL — ABNORMAL HIGH (ref 70–99)
Potassium: 3.8 mEq/L (ref 3.5–5.1)
Sodium: 139 mEq/L (ref 135–145)
Total Bilirubin: 0.5 mg/dL (ref 0.2–1.2)
Total Protein: 7.5 g/dL (ref 6.0–8.3)

## 2013-07-18 LAB — CBC WITH DIFFERENTIAL/PLATELET
BASOS ABS: 0 10*3/uL (ref 0.0–0.1)
BASOS PCT: 0.4 % (ref 0.0–3.0)
EOS ABS: 0 10*3/uL (ref 0.0–0.7)
Eosinophils Relative: 0.7 % (ref 0.0–5.0)
HCT: 37.6 % (ref 36.0–46.0)
Hemoglobin: 12.6 g/dL (ref 12.0–15.0)
LYMPHS PCT: 23.6 % (ref 12.0–46.0)
Lymphs Abs: 1.7 10*3/uL (ref 0.7–4.0)
MCHC: 33.6 g/dL (ref 30.0–36.0)
MCV: 86.7 fl (ref 78.0–100.0)
MONO ABS: 0.3 10*3/uL (ref 0.1–1.0)
Monocytes Relative: 4.9 % (ref 3.0–12.0)
Neutro Abs: 5 10*3/uL (ref 1.4–7.7)
Neutrophils Relative %: 70.4 % (ref 43.0–77.0)
Platelets: 303 10*3/uL (ref 150.0–400.0)
RBC: 4.34 Mil/uL (ref 3.87–5.11)
RDW: 12.3 % (ref 11.5–15.5)
WBC: 7.1 10*3/uL (ref 4.0–10.5)

## 2013-07-18 LAB — LIPID PANEL
CHOL/HDL RATIO: 3
CHOLESTEROL: 146 mg/dL (ref 0–200)
HDL: 48.8 mg/dL (ref >39.00)
LDL Cholesterol: 70 mg/dL (ref 0–99)
NonHDL: 97.2
TRIGLYCERIDES: 138 mg/dL (ref 0.0–149.0)
VLDL: 27.6 mg/dL (ref 0.0–40.0)

## 2013-07-18 LAB — TSH: TSH: 0.74 u[IU]/mL (ref 0.35–4.50)

## 2013-07-18 MED ORDER — ATORVASTATIN CALCIUM 20 MG PO TABS: ORAL_TABLET | ORAL | Status: DC

## 2013-07-18 MED ORDER — AMLODIPINE BESYLATE 5 MG PO TABS: ORAL_TABLET | ORAL | Status: DC

## 2013-07-18 MED ORDER — MONTELUKAST SODIUM 10 MG PO TABS: ORAL_TABLET | ORAL | Status: DC

## 2013-07-18 NOTE — Patient Instructions (Signed)
Try adding Zantac over the counter to your current Nexium (omeprazole).  We will call you with your lab results.  Let's stop your amlodipine.  Check your blood at home- 3 times a week or if you feel bad.  If it goes above 145/90, call me.

## 2013-07-18 NOTE — Assessment & Plan Note (Signed)
Discussed OA treatment. Tylenol is an excellent medication for OA as long as she does not exceed recommended dosage. The patient indicates understanding of these issues and agrees with the plan.

## 2013-07-18 NOTE — Progress Notes (Signed)
Pre visit review using our clinic review tool, if applicable. No additional management support is needed unless otherwise documented below in the visit note. 

## 2013-07-18 NOTE — Assessment & Plan Note (Signed)
Continue lipitor. Check labs today. 

## 2013-07-18 NOTE — Telephone Encounter (Signed)
Relevant patient education assigned to patient using Emmi. ° °

## 2013-07-18 NOTE — Assessment & Plan Note (Signed)
Well controlled. She would like a trial off of amlodipine and based on her BP today, I think this is reasonable. D/c amlodipine.  She has home BP cuff. She will check BP three times weekly and call me in if BP goes above 145/90. The patient indicates understanding of these issues and agrees with the plan.

## 2013-07-18 NOTE — Progress Notes (Signed)
63 yo pleasant female here for med refills.  OA- was told years ago but Dr. Corliss Skainseveshwar that she has OA. Lately, shoulders,  Knees and feet more painful.  Tylenol does help but she is not sure what she supposed to.  No redness or warmth of her joints.  HTN- takes amlodipine 5 mg daily.  Has been on it for years and wonders if she can stop taking it. No HA, blurred vision, CP or LE edema. Lab Results  Component Value Date   CREATININE 0.8 08/09/2012   Working on improving lifestyle- no coffee, no soft drinks. Wt Readings from Last 3 Encounters:  07/18/13 209 lb 8 oz (95.029 kg)  03/04/13 210 lb 8 oz (95.482 kg)  08/06/12 200 lb (90.719 kg)     HLD- taking Lipitor 20 mg daily. LDL went up went we tried to wean her off simvastatin in past.  Has also gained weight.  Due for labs.  Does have family h/o CAD- dad died of MI at 3561. She is not a smoker.  She is unaware of family h/o HLD.  Lab Results  Component Value Date   CHOL 231* 08/09/2012   HDL 49.40 08/09/2012   LDLCALC 60 06/05/2012   LDLDIRECT 162.7 08/09/2012   TRIG 122.0 08/09/2012   CHOLHDL 5 08/09/2012    GERD- deteriorated since insurance will not cover Nexium 40 mg.  So she is taking OTC omeprazole 20 mg daily. Not waking her up in the middle of night.  Patient Active Problem List   Diagnosis Date Noted  . Allergic reaction 03/04/2013  . DEPRESSION, SITUATIONAL 01/27/2009  . OBESITY 12/19/2007  . ESOPHAGITIS, REFLUX 12/19/2007  . RHINITIS 01/01/2007  . HYPERCHOLESTEROLEMIA 12/15/2006  . HYPERTENSION, ESSENTIAL NOS 12/04/2006   No past medical history on file. Past Surgical History  Procedure Laterality Date  . Vaginal hysterectomy      left ovary intact, DUB   History  Substance Use Topics  . Smoking status: Never Smoker   . Smokeless tobacco: Never Used  . Alcohol Use: Yes     Comment: rarely   Family History  Problem Relation Age of Onset  . Hyperlipidemia Mother   . Hypertension Mother   . Cancer Mother    breast  . Arthritis Mother   . Heart disease Father   . Emphysema Father   . Hypertension Sister   . Hypertension Brother   . Dementia Maternal Aunt     Alzheimers  . Dementia Maternal Aunt     Alzheimers   No Known Allergies Current Outpatient Prescriptions on File Prior to Visit  Medication Sig Dispense Refill  . NEXIUM 40 MG capsule TAKE 1 CAPSULE (40 MG TOTAL) BY MOUTH DAILY BEFORE BREAKFAST.  90 capsule  1   No current facility-administered medications on file prior to visit.   The PMH, PSH, Social History, Family History, Medications, and allergies [facility], and have been updated if relevant.  ROS: See HPI  Physical exam: BP 118/70  Pulse 75  Temp(Src) 97.7 F (36.5 C) (Oral)  Ht 5' 3.5" (1.613 m)  Wt 209 lb 8 oz (95.029 kg)  BMI 36.52 kg/m2  SpO2 97%  General:  Well-developed,well-nourished,in no acute distress; alert,appropriate and cooperative throughout examination Head:  normocephalic and atraumatic.   Eyes:  vision grossly intact, pupils equal, pupils round, and pupils reactive to light.   Ears:  R ear normal and L ear normal.   Nose:  no external deformity.   Mouth:  good dentition.   Neck:  No deformities, masses, or tenderness noted. Lungs:  Normal respiratory effort, chest expands symmetrically. Lungs are clear to auscultation, no crackles or wheezes. Heart:  Normal rate and regular rhythm. S1 and S2 normal without gallop, murmur, click, rub or other extra sounds. Msk:  No deformity or scoliosis noted of thoracic or lumbar spine.   Extremities:  No clubbing, cyanosis, edema, or deformity noted with normal full range of motion of all joints.   Neurologic:  alert & oriented X3 and gait normal.   Skin:  Intact without suspicious lesions or rashes Psych:  Cognition and judgment appear intact. Alert and cooperative with normal attention span and concentration. No apparent delusions, illusions, hallucinations

## 2013-07-18 NOTE — Assessment & Plan Note (Signed)
Deteriorated. Discussed dietary and life style changes. She has already stopped drinking coffee, non smoker. Stress has increased at work. Continue OTC 20 mg omeprazole, add OTC Zantac- see AVS. Call or return to clinic prn if these symptoms worsen or fail to improve as anticipated. The patient indicates understanding of these issues and agrees with the plan.

## 2013-10-18 ENCOUNTER — Telehealth: Payer: Self-pay

## 2013-10-18 NOTE — Telephone Encounter (Signed)
Pt was last seen 07/18/13; pt had H/A on 10/15/13 and took BP 169/93. Pt started back amlodipine. Pt did not take BP until 10/17/13 in PM BP was 143/83 (pt ate Barbeque sandwich last night). Pt did not take BP this morning; Today pt has facial pain around nose and under eyes. No fever, head congestion, dizziness, H/A, SOB or CP. Pt does feel flushed on and off. Pt wants to know if should continue taking BP med and any other instructions from Dr Dayton Martes. Pt did not want to schedule appt until got advice from Dr Dayton Martes. [facility] [place]. Pt request cb.

## 2013-10-18 NOTE — Telephone Encounter (Signed)
Spoke to pt and appt sched for 10/21/13 1500

## 2013-10-18 NOTE — Telephone Encounter (Signed)
She really should be seen for this.  I am glad that her BP has improved with amlodipine.

## 2013-10-21 ENCOUNTER — Encounter: Payer: Self-pay | Admitting: Family Medicine

## 2013-10-21 ENCOUNTER — Ambulatory Visit (INDEPENDENT_AMBULATORY_CARE_PROVIDER_SITE_OTHER): Payer: BC Managed Care – HMO | Admitting: Family Medicine

## 2013-10-21 VITALS — BP 148/88 | HR 93 | Temp 97.3°F | Wt 209.5 lb

## 2013-10-21 DIAGNOSIS — J0110 Acute frontal sinusitis, unspecified: Secondary | ICD-10-CM

## 2013-10-21 DIAGNOSIS — I10 Essential (primary) hypertension: Secondary | ICD-10-CM

## 2013-10-21 DIAGNOSIS — Z23 Encounter for immunization: Secondary | ICD-10-CM

## 2013-10-21 DIAGNOSIS — J019 Acute sinusitis, unspecified: Secondary | ICD-10-CM | POA: Insufficient documentation

## 2013-10-21 MED ORDER — MONTELUKAST SODIUM 10 MG PO TABS: ORAL_TABLET | ORAL | Status: DC

## 2013-10-21 MED ORDER — AMLODIPINE BESYLATE 5 MG PO TABS: ORAL_TABLET | ORAL | Status: DC

## 2013-10-21 MED ORDER — FLUTICASONE PROPIONATE 50 MCG/ACT NA SUSP: 2.0000 | Freq: Every day | NASAL | Status: DC

## 2013-10-21 MED ORDER — AMOXICILLIN-POT CLAVULANATE 875-125 MG PO TABS: 1.0000 | ORAL_TABLET | Freq: Two times a day (BID) | ORAL | Status: AC

## 2013-10-21 MED ORDER — ATORVASTATIN CALCIUM 20 MG PO TABS: ORAL_TABLET | ORAL | Status: DC

## 2013-10-21 NOTE — Progress Notes (Signed)
Pre visit review using our clinic review tool, if applicable. No additional management support is needed unless otherwise documented below in the visit note. 

## 2013-10-21 NOTE — Assessment & Plan Note (Signed)
Deteriorated. Agree with restarting amlodipine. She will continue to monitor BP at home and update me with readings.

## 2013-10-21 NOTE — Assessment & Plan Note (Addendum)
New. Given duration and progression of symptoms, will treat for bacterial sinusitis with Augmentin, restart flonase and continue singulair. Call or return to clinic prn if these symptoms worsen or fail to improve as anticipated. The patient indicates understanding of these issues and agrees with the plan.

## 2013-10-21 NOTE — Progress Notes (Signed)
Subjective:   Patient ID: Crystal Hardin, female    DOB: [birth year], 63 y.o.   MRN: [id number]  Crystal Hardin is a pleasant 63 y.o. year old female who presents to clinic today with Facial Pain  on 10/21/2013  HPI: HTN-  Crystal Hardin had a headache on 9/8 and took her BP which was 169/93.  Restarted her amlodipine and some of her headaches resolved but still having sinus pain/pressure.  Recently started having congestion.  Does have h/o allergic rhinitis- takes singulair daily.  Has not restarted flonase yet.  Not taking anything with sudafed.  Wt Readings from Last 3 Encounters:  10/21/13 209 lb 8 oz (95.029 kg)  07/18/13 209 lb 8 oz (95.029 kg)  03/04/13 210 lb 8 oz (95.482 kg)   Current Outpatient Prescriptions on File Prior to Visit  Medication Sig Dispense Refill  . NEXIUM 40 MG capsule TAKE 1 CAPSULE (40 MG TOTAL) BY MOUTH DAILY BEFORE BREAKFAST.  90 capsule  1   No current facility-administered medications on file prior to visit.    No Known Allergies  No past medical history on file.  Past Surgical History  Procedure Laterality Date  . Vaginal hysterectomy      left ovary intact, DUB    Family History  Problem Relation Age of Onset  . Hyperlipidemia Mother   . Hypertension Mother   . Cancer Mother     breast  . Arthritis Mother   . Heart disease Father   . Emphysema Father   . Hypertension Sister   . Hypertension Brother   . Dementia Maternal Aunt     Alzheimers  . Dementia Maternal Aunt     Alzheimers    History   Social History  . Marital Status: Single    Spouse Name: N/A    Number of Children: 2  . Years of Education: N/A   Occupational History  . Print production planner    Social History Main Topics  . Smoking status: Never Smoker   . Smokeless tobacco: Never Used  . Alcohol Use: Yes     Comment: rarely  . Drug Use: No  . Sexual Activity: Not on file   Other Topics Concern  . Not on file   Social History Narrative   Divorced   Engineer, manufacturing   Two children, daughter and son   The PMH, PSH, Social History, Family History, Medications, and allergies have been reviewed in [facility], and have been updated if relevant.   Review of Systems See HPI No LE No blurred vision No CP No SOB    Objective:    BP 148/88  Pulse 93  Temp(Src) 97.3 F (36.3 C) (Oral)  Wt 209 lb 8 oz (95.029 kg)  SpO2 97%   Physical Exam  Nursing note and vitals reviewed. Constitutional: She appears well-developed and well-nourished. No distress.  HENT:  Head: Normocephalic.  Right Ear: Hearing and tympanic membrane normal.  Left Ear: Hearing and tympanic membrane normal.  Nose: Rhinorrhea present. Right sinus exhibits frontal sinus tenderness. Left sinus exhibits frontal sinus tenderness.  Skin: Skin is warm, dry and intact. No rash noted.  Psychiatric: She has a normal mood and affect. Her speech is normal.          Assessment & Plan:   Need for prophylactic vaccination and inoculation against influenza - Plan: Flu Vaccine QUAD 36+ mos PF IM (Fluarix Quad PF)  HYPERTENSION, ESSENTIAL NOS  Acute frontal sinusitis, recurrence not specified No Follow-up on  file.

## 2013-10-21 NOTE — Patient Instructions (Addendum)
Good to see you. Continue amlodipine. Augmentin as directed.  Restart flonase.  Make an appointment with Dr. Patsy Lager.

## 2013-11-18 ENCOUNTER — Encounter: Payer: Self-pay | Admitting: Family Medicine

## 2013-11-18 ENCOUNTER — Ambulatory Visit (INDEPENDENT_AMBULATORY_CARE_PROVIDER_SITE_OTHER): Payer: BC Managed Care – HMO | Admitting: Family Medicine

## 2013-11-18 VITALS — BP 120/70 | HR 89 | Temp 98.2°F | Ht 63.5 in | Wt 209.5 lb

## 2013-11-18 DIAGNOSIS — M7502 Adhesive capsulitis of left shoulder: Secondary | ICD-10-CM

## 2013-11-18 MED ORDER — METHYLPREDNISOLONE ACETATE 40 MG/ML IJ SUSP
80.0000 mg | Freq: Once | INTRAMUSCULAR | Status: AC
  Administered 2013-11-18: 80 mg via INTRA_ARTICULAR

## 2013-11-18 NOTE — Progress Notes (Signed)
Pre visit review using our clinic review tool, if applicable. No additional management support is needed unless otherwise documented below in the visit note. 

## 2013-11-18 NOTE — Progress Notes (Signed)
Dr. Karleen HampshireSpencer T. Oryn Casanova, MD, CAQ Sports Medicine Primary [facility] and Sports Medicine [address] [place], [id number] Phone: [phone] Fax: [phone]  11/18/2013  Patient: Crystal Hardin, MRN: [id number], DOB: 05/30/1950, 63 y.o.  Primary Physician:  Ruthe Mannanalia Aron, MD  Chief Complaint: Shoulder Pain  Subjective:   Crystal Hardin is a 63 y.o. very pleasant female patient who presents with the following: shoulder pain  Consulting MD: Dr. Dayton MartesAron Reason: L shoulder pain  The patient noted above presents with shoulder pain that has been ongoing for 2-3 mo there is no history of trauma or accident. The patient denies neck pain or radicular symptoms. No shoulder blade pain Denies dislocation, subluxation, separation of the shoulder. The patient does complain of pain with flexion, abduction, and terminal motion.  Significant restriction of motion. she describes a deep ache around the shoulder, and sometimes it will wake the patient up at night.  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 2-3 months, 12-13 hours a day at work.   Medications Tried: alleve, tylenol Ice or Heat: minimal help Tried PT: No  Prior shoulder Injury: No Prior surgery: No Prior fracture: No  Past Medical History, Surgical History, Social History, Family History, Medications, and allergies reviewed and updated if relevant.   GEN: No fevers, chills. Nontoxic. Primarily MSK c/o today. MSK: Detailed in the HPI GI: tolerating PO intake without difficulty Neuro: No numbness, parasthesias, or tingling associated. Otherwise the pertinent positives of the ROS are noted above.    Objective:   Blood pressure 120/70, pulse 89, temperature 98.2 F (36.8 C), temperature source Oral, height 5' 3.5" (1.613 m), weight 209 lb 8 oz (95.029 kg).  GEN: Well-developed,well-nourished,in no acute distress; alert,appropriate and cooperative  throughout examination HEENT: Normocephalic and atraumatic without obvious abnormalities. Ears, externally no deformities PULM: Breathing comfortably in no respiratory distress EXT: No clubbing, cyanosis, or edema PSYCH: Normally interactive. Cooperative during the interview. Pleasant. Friendly and conversant. Not anxious or depressed appearing. Normal, full affect.  Shoulder: L Inspection: No muscle wasting or winging Ecchymosis/edema: neg  AC joint, scapula, clavicle: ttp at ac Cervical spine: NT, full ROM Spurling's: neg Abduction: 5/5, 110 Flexion: 5/5, 145 IR, full, lift-off: 5/5, minimal at 90 deg abd ER at neutral: 5/5, lacks 5 deg AC crossover and compression: unable to complete Additional special testing is equivocal given lack of motion C5-T1 intact Sensation intact Grip 5/5  Assessment and Plan:   Frozen shoulder, left - Plan: Ambulatory referral to Physical Therapy  >25 minutes spent in face to face time with patient, >50% spent in counselling or coordination of care  Patient was given a systematic ROM protocol from [facility] to be done daily. Emphasized importance of adherence, help of PT, daily HEP.  The average length of total symptoms is 12 months going through 3 different phases in the freezing and thawing process. Reviewed all with patient.   Tylenol or NSAID of choice prn for pain relief Intraarticular shoulder injections discussed with patient, which have good evidence for accelerating the thawing phase.  Patient will be sent for formal PT for aggressive frozen shoulder ROM. Will need RTC str and scapular stabilization to fix underlying mechanics.  Intrarticular Shoulder Injection, LEFT Verbal consent was obtained from the patient. Risks including infection explained and contrasted with benefits and alternatives. Patient prepped with Chloraprep and Ethyl Chloride used for anesthesia. An intraarticular shoulder injection was performed using the  posterior  approach. The patient tolerated the procedure well and had decreased pain post injection. No complications. Injection: 8 cc of Lidocaine 1% and Depo-Medrol 80 mg. Needle: 22 gauge   Follow-up: 2 mo  New Prescriptions   No medications on file   Orders Placed This Encounter  Procedures  . Ambulatory referral to Physical Therapy    Signed,  Karleen HampshireSpencer T. Jahziel Sinn, MD   Patient's Medications  New Prescriptions   No medications on file  Previous Medications   AMLODIPINE (NORVASC) 5 MG TABLET    TAKE 1 TABLET (5 MG TOTAL) BY MOUTH DAILY.   ATORVASTATIN (LIPITOR) 20 MG TABLET    TAKE ONE TABLET BY MOUTH DAILY.   FLUTICASONE (FLONASE) 50 MCG/ACT NASAL SPRAY    Place 2 sprays into both nostrils daily.   MONTELUKAST (SINGULAIR) 10 MG TABLET    TAKE 1 TABLET (10 MG TOTAL) BY MOUTH DAILY.   NEXIUM 40 MG CAPSULE    TAKE 1 CAPSULE (40 MG TOTAL) BY MOUTH DAILY BEFORE BREAKFAST.  Modified Medications   No medications on file  Discontinued Medications   No medications on file

## 2013-11-18 NOTE — Addendum Note (Signed)
Addended by: Damita LackLORING, DONNA S on: 11/18/2013 09:51 AM   Modules accepted: Orders

## 2013-11-22 ENCOUNTER — Other Ambulatory Visit: Payer: Self-pay

## 2014-01-20 ENCOUNTER — Ambulatory Visit: Payer: BC Managed Care – HMO | Admitting: Family Medicine

## 2014-08-07 ENCOUNTER — Encounter: Payer: Self-pay | Admitting: *Deleted

## 2014-09-10 ENCOUNTER — Encounter: Payer: Self-pay | Admitting: *Deleted

## 2014-10-15 ENCOUNTER — Other Ambulatory Visit: Payer: Self-pay | Admitting: Family Medicine

## 2014-11-04 ENCOUNTER — Encounter: Payer: Self-pay | Admitting: Family Medicine

## 2014-11-04 ENCOUNTER — Ambulatory Visit (INDEPENDENT_AMBULATORY_CARE_PROVIDER_SITE_OTHER): Payer: 59 | Admitting: Family Medicine

## 2014-11-04 VITALS — BP 124/70 | HR 76 | Temp 97.8°F | Wt 209.2 lb

## 2014-11-04 DIAGNOSIS — Z23 Encounter for immunization: Secondary | ICD-10-CM

## 2014-11-04 DIAGNOSIS — E7800 Pure hypercholesterolemia, unspecified: Secondary | ICD-10-CM

## 2014-11-04 DIAGNOSIS — R300 Dysuria: Secondary | ICD-10-CM | POA: Insufficient documentation

## 2014-11-04 DIAGNOSIS — I10 Essential (primary) hypertension: Secondary | ICD-10-CM

## 2014-11-04 DIAGNOSIS — R0981 Nasal congestion: Secondary | ICD-10-CM | POA: Diagnosis not present

## 2014-11-04 LAB — COMPREHENSIVE METABOLIC PANEL
ALBUMIN: 4.4 g/dL (ref 3.5–5.2)
ALK PHOS: 83 U/L (ref 39–117)
ALT: 19 U/L (ref 0–35)
AST: 17 U/L (ref 0–37)
BILIRUBIN TOTAL: 0.5 mg/dL (ref 0.2–1.2)
BUN: 17 mg/dL (ref 6–23)
CALCIUM: 10 mg/dL (ref 8.4–10.5)
CO2: 32 mEq/L (ref 19–32)
CREATININE: 0.73 mg/dL (ref 0.40–1.20)
Chloride: 100 mEq/L (ref 96–112)
GFR: 85.45 mL/min (ref >60.00)
Glucose, Bld: 102 mg/dL — ABNORMAL HIGH (ref 70–99)
Potassium: 3.8 mEq/L (ref 3.5–5.1)
SODIUM: 140 meq/L (ref 135–145)
TOTAL PROTEIN: 7.8 g/dL (ref 6.0–8.3)

## 2014-11-04 LAB — POCT URINALYSIS DIPSTICK
BILIRUBIN UA: NEGATIVE
GLUCOSE UA: NEGATIVE
Ketones, UA: NEGATIVE
NITRITE UA: NEGATIVE
Protein, UA: POSITIVE
RBC UA: POSITIVE
Spec Grav, UA: 1.015
Urobilinogen, UA: 0.2
pH, UA: 7

## 2014-11-04 LAB — LIPID PANEL
CHOLESTEROL: 162 mg/dL (ref 0–200)
HDL: 52.9 mg/dL (ref >39.00)
LDL CALC: 83 mg/dL (ref 0–99)
NonHDL: 109.52
TRIGLYCERIDES: 132 mg/dL (ref 0.0–149.0)
Total CHOL/HDL Ratio: 3
VLDL: 26.4 mg/dL (ref 0.0–40.0)

## 2014-11-04 MED ORDER — AMLODIPINE BESYLATE 5 MG PO TABS: ORAL_TABLET | ORAL | Status: DC

## 2014-11-04 MED ORDER — MONTELUKAST SODIUM 10 MG PO TABS
ORAL_TABLET | ORAL | Status: DC
Start: 2014-11-04 — End: 2014-11-05

## 2014-11-04 MED ORDER — ATORVASTATIN CALCIUM 20 MG PO TABS: 20.0000 mg | ORAL_TABLET | Freq: Every day | ORAL | Status: DC

## 2014-11-04 MED ORDER — CIPROFLOXACIN HCL 250 MG PO TABS: 250.0000 mg | ORAL_TABLET | Freq: Two times a day (BID) | ORAL | Status: DC

## 2014-11-04 NOTE — Progress Notes (Signed)
Subjective:   Patient ID: Crystal Hardin, female    DOB: Oct 01, 1950, 64 y.o.   MRN: [id number]  Crystal Hardin is a pleasant 64 y.o. year old female who presents to clinic today with Follow-up; Nasal Congestion; and Dysuria  on 11/04/2014  HPI:  Dysuria- intermittent dysuria for a few weeks, some left lower back pain.  No nausea or vomiting.  No hematuria. No fevers, nausea, or vomiting.  Nasal congestion- more rhinorrhea, sinus pressure for 4 or 5 days, + tooth pain. Taking singulair and tylenol sinus. No wheezing or SOB.  HTN- taking norvasc 5 mg daily.  Denies any HA, blurred vision, CP, or SOB. Lab Results  Component Value Date   CREATININE 0.7 07/18/2013   HLD- on lipitor 20 mg daily.  Due for labs.  Lab Results  Component Value Date   CHOL 146 07/18/2013   HDL 48.80 07/18/2013   LDLCALC 70 07/18/2013   LDLDIRECT 162.7 08/09/2012   TRIG 138.0 07/18/2013   CHOLHDL 3 07/18/2013   Lab Results  Component Value Date   ALT 19 07/18/2013   AST 18 07/18/2013   ALKPHOS 65 07/18/2013   BILITOT 0.5 07/18/2013   Current Outpatient Prescriptions on File Prior to Visit  Medication Sig Dispense Refill  . fluticasone (FLONASE) 50 MCG/ACT nasal spray Place 2 sprays into both nostrils daily. 16 g 6  . NEXIUM 40 MG capsule TAKE 1 CAPSULE (40 MG TOTAL) BY MOUTH DAILY BEFORE BREAKFAST. 90 capsule 1   No current facility-administered medications on file prior to visit.    No Known Allergies  History reviewed. No pertinent past medical history.  Past Surgical History  Procedure Laterality Date  . Vaginal hysterectomy      left ovary intact, DUB    Family History  Problem Relation Age of Onset  . Hyperlipidemia Mother   . Hypertension Mother   . Cancer Mother     breast  . Arthritis Mother   . Heart disease Father   . Emphysema Father   . Hypertension Sister   . Hypertension Brother   . Dementia Maternal Aunt     Alzheimers  . Dementia Maternal Aunt    Alzheimers    Social History   Social History  . Marital Status: Single    Spouse Name: N/A  . Number of Children: 2  . Years of Education: N/A   Occupational History  . Print production planner    Social History Main Topics  . Smoking status: Never Smoker   . Smokeless tobacco: Never Used  . Alcohol Use: Yes     Comment: rarely  . Drug Use: No  . Sexual Activity: Not on file   Other Topics Concern  . Not on file   Social History Narrative   Divorced   Print production planner   Two children, daughter and son   The PMH, PSH, Social History, Family History, Medications, and allergies have been reviewed in [facility], and have been updated if relevant.   Review of Systems  Constitutional: Negative.   HENT: Positive for sinus pressure, sneezing and sore throat. Negative for trouble swallowing and voice change.   Respiratory: Negative.   Cardiovascular: Negative.   Gastrointestinal: Negative.   Endocrine: Negative.   Genitourinary: Positive for dysuria and frequency.  Musculoskeletal: Positive for back pain.  Skin: Negative.   Allergic/Immunologic: Negative.   Neurological: Negative.   Hematological: Negative.   Psychiatric/Behavioral: Negative.   All other systems reviewed and are negative.  Objective:    BP 124/70 mmHg  Pulse 76  Temp(Src) 97.8 F (36.6 C) (Oral)  Wt 209 lb 4 oz (94.915 kg)  SpO2 98%   Physical Exam  Constitutional: She is oriented to person, place, and time. She appears well-developed and well-nourished. No distress.  HENT:  Head: Normocephalic.  Right Ear: Hearing and tympanic membrane normal.  Left Ear: Hearing and tympanic membrane normal.  Nose: Rhinorrhea present. Right sinus exhibits frontal sinus tenderness. Left sinus exhibits frontal sinus tenderness.  Mouth/Throat: Uvula is midline, oropharynx is clear and moist and mucous membranes are normal.  Eyes: Conjunctivae are normal.  Cardiovascular: Normal rate and regular rhythm.   Pulmonary/Chest:  Effort normal and breath sounds normal. No respiratory distress.  Musculoskeletal: Normal range of motion. She exhibits no edema.  Neurological: She is alert and oriented to person, place, and time. No cranial nerve deficit.  Skin: Skin is warm and dry.  Psychiatric: She has a normal mood and affect. Her behavior is normal. Judgment and thought content normal.  Nursing note and vitals reviewed.         Assessment & Plan:   Dysuria - Plan: Urinalysis Dipstick  Need for influenza vaccination - Plan: Flu Vaccine QUAD 36+ mos PF IM (Fluarix & Fluzone Quad PF)  Nasal congestion  HYPERCHOLESTEROLEMIA - Plan: Comprehensive metabolic panel, Lipid panel  Essential hypertension No Follow-up on file.

## 2014-11-04 NOTE — Assessment & Plan Note (Signed)
Allergic rhinitis- advised nasal spray.  If bacterial, cipro given for UTI.

## 2014-11-04 NOTE — Progress Notes (Signed)
Pre visit review using our clinic review tool, if applicable. No additional management support is needed unless otherwise documented below in the visit note. 

## 2014-11-04 NOTE — Assessment & Plan Note (Signed)
New- UA pos for LE, RBCs and has symptoms of classic cystitis. Treat with cipro 250 mg twice daily x 7 days. The patient indicates understanding of these issues and agrees with the plan.

## 2014-11-04 NOTE — Assessment & Plan Note (Signed)
Well controlled.  No changes made. 

## 2014-11-04 NOTE — Assessment & Plan Note (Signed)
Due for labs today. 

## 2014-11-05 ENCOUNTER — Other Ambulatory Visit: Payer: Self-pay

## 2014-11-05 MED ORDER — MONTELUKAST SODIUM 10 MG PO TABS: ORAL_TABLET | ORAL | Status: DC

## 2014-11-05 MED ORDER — ATORVASTATIN CALCIUM 20 MG PO TABS: 20.0000 mg | ORAL_TABLET | Freq: Every day | ORAL | Status: DC

## 2014-11-05 MED ORDER — AMLODIPINE BESYLATE 5 MG PO TABS: ORAL_TABLET | ORAL | Status: DC

## 2014-11-05 NOTE — Telephone Encounter (Signed)
Pt seen 09*27/16 and wants 90 day rx with refills for amlodipine,atorvastatin and montelukast; advised pt done. Spoke with Brett Canales at [employer] and cancelled # [phone] and sent # 90 x 3 electronically.

## 2014-11-06 LAB — URINE CULTURE

## 2015-03-21 ENCOUNTER — Encounter: Payer: Self-pay | Admitting: Emergency Medicine

## 2015-03-21 ENCOUNTER — Emergency Department: Payer: 59

## 2015-03-21 ENCOUNTER — Emergency Department
Admission: EM | Admit: 2015-03-21 | Discharge: 2015-03-21 | Disposition: A | Discharge status: Home / Self Care | Payer: 59 | Attending: Emergency Medicine | Admitting: Emergency Medicine

## 2015-03-21 DIAGNOSIS — Z79899 Other long term (current) drug therapy: Secondary | ICD-10-CM | POA: Insufficient documentation

## 2015-03-21 DIAGNOSIS — Y998 Other external cause status: Secondary | ICD-10-CM | POA: Diagnosis not present

## 2015-03-21 DIAGNOSIS — Y9289 Other specified places as the place of occurrence of the external cause: Secondary | ICD-10-CM | POA: Insufficient documentation

## 2015-03-21 DIAGNOSIS — S9032XA Contusion of left foot, initial encounter: Secondary | ICD-10-CM | POA: Diagnosis not present

## 2015-03-21 DIAGNOSIS — Y9389 Activity, other specified: Secondary | ICD-10-CM | POA: Insufficient documentation

## 2015-03-21 DIAGNOSIS — S99922A Unspecified injury of left foot, initial encounter: Secondary | ICD-10-CM | POA: Diagnosis present

## 2015-03-21 DIAGNOSIS — W010XXA Fall on same level from slipping, tripping and stumbling without subsequent striking against object, initial encounter: Secondary | ICD-10-CM | POA: Insufficient documentation

## 2015-03-21 DIAGNOSIS — S92302A Fracture of unspecified metatarsal bone(s), left foot, initial encounter for closed fracture: Secondary | ICD-10-CM

## 2015-03-21 DIAGNOSIS — Z792 Long term (current) use of antibiotics: Secondary | ICD-10-CM | POA: Insufficient documentation

## 2015-03-21 DIAGNOSIS — I10 Essential (primary) hypertension: Secondary | ICD-10-CM | POA: Insufficient documentation

## 2015-03-21 DIAGNOSIS — Z7951 Long term (current) use of inhaled steroids: Secondary | ICD-10-CM | POA: Insufficient documentation

## 2015-03-21 DIAGNOSIS — S92352A Displaced fracture of fifth metatarsal bone, left foot, initial encounter for closed fracture: Secondary | ICD-10-CM | POA: Insufficient documentation

## 2015-03-21 HISTORY — DX: Essential (primary) hypertension: I10

## 2015-03-21 MED ORDER — HYDROCODONE-ACETAMINOPHEN 5-325 MG PO TABS
2.0000 | ORAL_TABLET | Freq: Once | ORAL | Status: AC
  Administered 2015-03-21: 2 via ORAL
  Filled 2015-03-21: qty 2

## 2015-03-21 MED ORDER — HYDROCODONE-ACETAMINOPHEN 5-325 MG PO TABS: 1.0000 | ORAL_TABLET | ORAL | Status: DC | PRN

## 2015-03-21 NOTE — ED Notes (Signed)
Pt a/o. Slight bruising lateral left foot. Left foot pain with movement, painful to touch. + dp, pt pulses.

## 2015-03-21 NOTE — ED Notes (Signed)
Tripped and fell last pm, pain and swelling to left ankle

## 2015-03-21 NOTE — Discharge Instructions (Signed)
Metatarsal Fracture A metatarsal fracture is a break in a metatarsal bone. Metatarsal bones connect your toe bones to your ankle bones. CAUSES This type of fracture may be caused by:  A sudden twisting of your foot.  A fall onto your foot.  Overuse or repetitive exercise. RISK FACTORS This condition is more likely to develop in people who:  Play contact sports.  Have a bone disease.  Have a low calcium level. SYMPTOMS Symptoms of this condition include:  Pain that is worse when walking or standing.  Pain when pressing on the foot or moving the toes.  Swelling.  Bruising on the top or bottom of the foot.  A foot that appears shorter than the other one. DIAGNOSIS This condition is diagnosed with a physical exam. You may also have imaging tests, such as:  X-rays.  A CT scan.  MRI. TREATMENT Treatment for this condition depends on its severity and whether a bone has moved out of place. Treatment may involve:  Rest.  Wearing foot support such as a cast, splint, or boot for several weeks.  Using crutches.  Surgery to move bones back into the right position. Surgery is usually needed if there are many pieces of broken bone or bones that are very out of place (displaced fracture).  Physical therapy. This may be needed to help you regain full movement and strength in your foot. You will need to return to your health care provider to have X-rays taken until your bones heal. Your health care provider will look at the X-rays to make sure that your foot is healing well. HOME CARE INSTRUCTIONS  If You Have a Cast:  Do not stick anything inside the cast to scratch your skin. Doing that increases your risk of infection.  Check the skin around the cast every day. Report any concerns to your health care provider. You may put lotion on dry skin around the edges of the cast. Do not apply lotion to the skin underneath the cast.  Keep the cast clean and dry. If You Have a Splint  or a Supportive Boot:  Wear it as directed by your health care provider. Remove it only as directed by your health care provider.  Loosen it if your toes become numb and tingle, or if they turn cold and blue.  Keep it clean and dry. Bathing  Do not take baths, swim, or use a hot tub until your health care provider approves. Ask your health care provider if you can take showers. You may only be allowed to take sponge baths for bathing.  If your health care provider approves bathing and showering, cover the cast or splint with a watertight plastic bag to protect it from water. Do not let the cast or splint get wet. Managing Pain, Stiffness, and Swelling  If directed, apply ice to the injured area (if you have a splint, not a cast).  Put ice in a plastic bag.  Place a towel between your skin and the bag.  Leave the ice on for 20 minutes, 2-3 times per day.  Move your toes often to avoid stiffness and to lessen swelling.  Raise (elevate) the injured area above the level of your heart while you are sitting or lying down. Driving  Do not drive or operate heavy machinery while taking pain medicine.  Do not drive while wearing foot support on a foot that you use for driving. Activity  Return to your normal activities as directed by your health care  provider. Ask your health care provider what activities are safe for you.  Perform exercises as directed by your health care provider or physical therapist. Safety  Do not use the injured foot to support your body weight until your health care provider says that you can. Use crutches as directed by your health care provider. General Instructions  Do not put pressure on any part of the cast or splint until it is fully hardened. This may take several hours.  Do not use any tobacco products, including cigarettes, chewing tobacco, or e-cigarettes. Tobacco can delay bone healing. If you need help quitting, ask your health care  provider.  Take medicines only as directed by your health care provider.  Keep all follow-up visits as directed by your health care provider. This is important. SEEK MEDICAL CARE IF:  You have a fever.  Your cast, splint, or boot is too loose or too tight.  Your cast, splint, or boot is damaged.  Your pain medicine is not helping.  You have pain, tingling, or numbness in your foot that is not going away. SEEK IMMEDIATE MEDICAL CARE IF:  You have severe pain.  You have tingling or numbness in your foot that is getting worse.  Your foot feels cold or becomes numb.  Your foot changes color.   This information is not intended to replace advice given to you by your health care provider. Make sure you discuss any questions you have with your health care provider.   Document Released: 10/16/2001 Document Revised: 06/10/2014 Document Reviewed: 11/20/2013 Elsevier Interactive Patient Education 2016 [employer]   Follow up with Dr. Alberteen Spindle.  Call Monday for an appointment. Ice and elevate to reduce swelling and pain. Use crutches when walking. Norco as needed for severe pain. You may also take ibuprofen 4 times a day with food for inflammation and pain.

## 2015-03-21 NOTE — ED Provider Notes (Signed)
[facility] Emergency Department Provider Note ____________________________________________  Time seen: Approximately 8:42 AM  I have reviewed the triage vital signs and the nursing notes.   HISTORY  Chief Complaint Ankle Pain  HPI Crystal Hardin is a 65 y.o. female is here with complaint of left foot pain after falling last evening. Patient states she tripped and fail and has had swelling in her foot since that time. This morning and she is unable to bear weight secondary to pain. She denies any previous injury to her ankle or foot. She denies any head injury or loss of consciousness during this event. She has taken over-the-counter medication without any relief. She has her daughters crutches available to her.Currently her pain is 10 over 10.   Past Medical History  Diagnosis Date  . Hypertension   . Hyperchloremia     Patient Active Problem List   Diagnosis Date Noted  . Dysuria 11/04/2014  . Nasal congestion 11/04/2014  . Polyarthralgia 07/18/2013  . Allergic reaction 03/04/2013  . OBESITY 12/19/2007  . ESOPHAGITIS, REFLUX 12/19/2007  . RHINITIS 01/01/2007  . HYPERCHOLESTEROLEMIA 12/15/2006  . Essential hypertension 12/04/2006    Past Surgical History  Procedure Laterality Date  . Vaginal hysterectomy      left ovary intact, DUB    Current Outpatient Rx  Name  Route  Sig  Dispense  Refill  . amLODipine (NORVASC) 5 MG tablet      TAKE 1 TABLET (5 MG TOTAL) BY MOUTH DAILY.   90 tablet   3   . atorvastatin (LIPITOR) 20 MG tablet   Oral   Take 1 tablet (20 mg total) by mouth daily.   90 tablet   3   . ciprofloxacin (CIPRO) 250 MG tablet   Oral   Take 1 tablet (250 mg total) by mouth 2 (two) times daily.   14 tablet   0   . fluticasone (FLONASE) 50 MCG/ACT nasal spray   Each Nare   Place 2 sprays into both nostrils daily.   16 g   6   . HYDROcodone-acetaminophen (NORCO/VICODIN) 5-325 MG tablet   Oral   Take 1 tablet by  mouth every 4 (four) hours as needed for moderate pain.   20 tablet   0   . montelukast (SINGULAIR) 10 MG tablet      TAKE 1 TABLET (10 MG TOTAL) BY MOUTH DAILY.   90 tablet   3   . NEXIUM 40 MG capsule      TAKE 1 CAPSULE (40 MG TOTAL) BY MOUTH DAILY BEFORE BREAKFAST.   90 capsule   1     Allergies Review of patient's allergies indicates no known allergies.  Family History  Problem Relation Age of Onset  . Hyperlipidemia Mother   . Hypertension Mother   . Cancer Mother     breast  . Arthritis Mother   . Heart disease Father   . Emphysema Father   . Hypertension Sister   . Hypertension Brother   . Dementia Maternal Aunt     Alzheimers  . Dementia Maternal Aunt     Alzheimers    Social History Social History  Substance Use Topics  . Smoking status: Never Smoker   . Smokeless tobacco: Never Used  . Alcohol Use: Yes     Comment: rarely    Review of Systems Constitutional: No fever/chills Eyes: No visual changes. ENT: No trauma Cardiovascular: Denies chest pain. Respiratory: Denies shortness of breath. Gastrointestinal:   No nausea,  no vomiting.  Genitourinary: Negative for dysuria. Musculoskeletal: Negative for back pain. Left foot pain. Skin: Negative for rash. Neurological: Negative for headaches, focal weakness or numbness.  10-point ROS otherwise negative.  ____________________________________________   PHYSICAL EXAM:  VITAL SIGNS: ED Triage Vitals  Enc Vitals Group     BP --      Pulse Rate 03/21/15 0831 82     Resp 03/21/15 0831 18     Temp 03/21/15 0831 97.4 F (36.3 C)     Temp Source 03/21/15 0831 Oral     SpO2 03/21/15 0831 96 %     Weight 03/21/15 0831 202 lb (91.627 kg)     Height 03/21/15 0831  (1.6 m)     Head Cir --      Peak Flow --      Pain Score 03/21/15 0831 10     Pain Loc --      Pain Edu? --      Excl. in GC? --     Constitutional: Alert and oriented. Well appearing and in no acute distress. Eyes:  Conjunctivae are normal. PERRL. EOMI. Head: Atraumatic. Nose: No congestion/rhinnorhea. Neck: No stridor.  No cervical tenderness on palpation posteriorly. Range of motion neck is good in all planes without any difficulty or pain. Cardiovascular: Normal rate, regular rhythm. Grossly normal heart sounds.  Good peripheral circulation. Respiratory: Normal respiratory effort.  No retractions. Lungs CTAB. Gastrointestinal: Soft and nontender. No distention. Musculoskeletal: Moves upper extremities without any difficulty. Left foot marked tenderness on palpation with slight bruising and soft tissue swelling lateral aspect. Motor sensory function intact. Digits move slowly secondary to pain. Neurologic:  Normal speech and language. No gross focal neurologic deficits are appreciated. No gait instability. Skin:  Skin is warm, dry and intact. Ecchymosis noted lateral dorsum of left foot. No abrasions were noted. Psychiatric: Mood and affect are normal. Speech and behavior are normal.  ____________________________________________   LABS (all labs ordered are listed, but only abnormal results are displayed)  Labs Reviewed - No data to display   RADIOLOGY  Left ankle per radiology shows minimally displaced fracture base of the fifth metatarsal. I, Tommi Rumps, personally viewed and evaluated these images (plain radiographs) as part of my medical decision making, as well as reviewing the written report by the radiologist.  ____________________________________________   PROCEDURES  Procedure(s) performed: None  Critical Care performed: No  ____________________________________________   INITIAL IMPRESSION / ASSESSMENT AND PLAN / ED COURSE  Pertinent labs & imaging results that were available during my care of the patient were reviewed by me and considered in my medical decision making (see chart for details).  Patient was placed in an Ace wrap and wooden shoe. She is to use her  daughter's crutches that are available to her. She is given a prescription for Norco as needed for severe pain. She is to follow-up with the podiatrist on call, Dr. Alberteen Spindle.  She is encouraged to ice and elevate as needed for swelling and pain.  ____________________________________________   FINAL CLINICAL IMPRESSION(S) / ED DIAGNOSES  Final diagnoses:  Fracture of 5th metatarsal, left, closed, initial encounter      Tommi Rumps, PA-C 03/21/15 1015  Minna Antis, MD 03/21/15 1419

## 2015-04-15 ENCOUNTER — Ambulatory Visit (INDEPENDENT_AMBULATORY_CARE_PROVIDER_SITE_OTHER): Payer: 59 | Admitting: Internal Medicine

## 2015-04-15 ENCOUNTER — Encounter: Payer: Self-pay | Admitting: Internal Medicine

## 2015-04-15 VITALS — BP 128/76 | HR 97 | Temp 98.4°F

## 2015-04-15 DIAGNOSIS — J069 Acute upper respiratory infection, unspecified: Secondary | ICD-10-CM

## 2015-04-15 DIAGNOSIS — R52 Pain, unspecified: Secondary | ICD-10-CM

## 2015-04-15 DIAGNOSIS — B9789 Other viral agents as the cause of diseases classified elsewhere: Principal | ICD-10-CM

## 2015-04-15 LAB — POCT INFLUENZA A/B
Influenza A, POC: NEGATIVE
Influenza B, POC: NEGATIVE

## 2015-04-15 NOTE — Patient Instructions (Signed)
Upper Respiratory Infection, Adult Most upper respiratory infections (URIs) are a viral infection of the air passages leading to the lungs. A URI affects the nose, throat, and upper air passages. The most common type of URI is nasopharyngitis and is typically referred to as "the common cold." URIs run their course and usually go away on their own. Most of the time, a URI does not require medical attention, but sometimes a bacterial infection in the upper airways can follow a viral infection. This is called a secondary infection. Sinus and middle ear infections are common types of secondary upper respiratory infections. Bacterial pneumonia can also complicate a URI. A URI can worsen asthma and chronic obstructive pulmonary disease (COPD). Sometimes, these complications can require emergency medical care and may be life threatening.  CAUSES Almost all URIs are caused by viruses. A virus is a type of germ and can spread from one person to another.  RISKS FACTORS You may be at risk for a URI if:   You smoke.   You have chronic heart or lung disease.  You have a weakened defense (immune) system.   You are very young or very old.   You have nasal allergies or asthma.  You work in crowded or poorly ventilated areas.  You work in health care facilities or schools. SIGNS AND SYMPTOMS  Symptoms typically develop 2-3 days after you come in contact with a cold virus. Most viral URIs last 7-10 days. However, viral URIs from the influenza virus (flu virus) can last 14-18 days and are typically more severe. Symptoms may include:   Runny or stuffy (congested) nose.   Sneezing.   Cough.   Sore throat.   Headache.   Fatigue.   Fever.   Loss of appetite.   Pain in your forehead, behind your eyes, and over your cheekbones (sinus pain).  Muscle aches.  DIAGNOSIS  Your health care provider may diagnose a URI by:  Physical exam.  Tests to check that your symptoms are not due to  another condition such as:  Strep throat.  Sinusitis.  Pneumonia.  Asthma. TREATMENT  A URI goes away on its own with time. It cannot be cured with medicines, but medicines may be prescribed or recommended to relieve symptoms. Medicines may help:  Reduce your fever.  Reduce your cough.  Relieve nasal congestion. HOME CARE INSTRUCTIONS   Take medicines only as directed by your health care provider.   Gargle warm saltwater or take cough drops to comfort your throat as directed by your health care provider.  Use a warm mist humidifier or inhale steam from a shower to increase air moisture. This may make it easier to breathe.  Drink enough fluid to keep your urine clear or pale yellow.   Eat soups and other clear broths and maintain good nutrition.   Rest as needed.   Return to work when your temperature has returned to normal or as your health care provider advises. You may need to stay home longer to avoid infecting others. You can also use a face mask and careful hand washing to prevent spread of the virus.  Increase the usage of your inhaler if you have asthma.   Do not use any tobacco products, including cigarettes, chewing tobacco, or electronic cigarettes. If you need help quitting, ask your health care provider. PREVENTION  The best way to protect yourself from getting a cold is to practice good hygiene.   Avoid oral or hand contact with people with cold   symptoms.   Wash your hands often if contact occurs.  There is no clear evidence that vitamin C, vitamin E, echinacea, or exercise reduces the chance of developing a cold. However, it is always recommended to get plenty of rest, exercise, and practice good nutrition.  SEEK MEDICAL CARE IF:   You are getting worse rather than better.   Your symptoms are not controlled by medicine.   You have chills.  You have worsening shortness of breath.  You have brown or red mucus.  You have yellow or brown nasal  discharge.  You have pain in your face, especially when you bend forward.  You have a fever.  You have swollen neck glands.  You have pain while swallowing.  You have white areas in the back of your throat. SEEK IMMEDIATE MEDICAL CARE IF:   You have severe or persistent:  Headache.  Ear pain.  Sinus pain.  Chest pain.  You have chronic lung disease and any of the following:  Wheezing.  Prolonged cough.  Coughing up blood.  A change in your usual mucus.  You have a stiff neck.  You have changes in your:  Vision.  Hearing.  Thinking.  Mood. MAKE SURE YOU:   Understand these instructions.  Will watch your condition.  Will get help right away if you are not doing well or get worse.   This information is not intended to replace advice given to you by your health care provider. Make sure you discuss any questions you have with your health care provider.   Document Released: 07/20/2000 Document Revised: 06/10/2014 Document Reviewed: 05/01/2013 Elsevier Interactive Patient Education 2016 Elsevier Inc.  

## 2015-04-15 NOTE — Addendum Note (Signed)
Addended by: Roena MaladyEVONTENNO, Amran Malter Y on: 04/15/2015 03:38 PM   Modules accepted: Orders

## 2015-04-15 NOTE — Progress Notes (Signed)
Pre visit review using our clinic review tool, if applicable. No additional management support is needed unless otherwise documented below in the visit note. 

## 2015-04-15 NOTE — Progress Notes (Signed)
HPI  Pt presents to the clinic today with c/o headache, nasal congestion, sore throat and cough. This started yesterday. She is not blowing anything out of her nose. The cough is non productive. She denies fever but has had chills and body aches. She has tried Tylenol cold and sinus with minimal relief. She has no history of seasonal allergies or breathing problems. She has had sick contacts diagnosed with the flu. She did get her flu shot.  Review of Systems      Past Medical History  Diagnosis Date  . Hypertension   . Hyperchloremia     Family History  Problem Relation Age of Onset  . Hyperlipidemia Mother   . Hypertension Mother   . Cancer Mother     breast  . Arthritis Mother   . Heart disease Father   . Emphysema Father   . Hypertension Sister   . Hypertension Brother   . Dementia Maternal Aunt     Alzheimers  . Dementia Maternal Aunt     Alzheimers    Social History   Social History  . Marital Status: Single    Spouse Name: N/A  . Number of Children: 2  . Years of Education: N/A   Occupational History  . Print production plannerffice Manager    Social History Main Topics  . Smoking status: Never Smoker   . Smokeless tobacco: Never Used  . Alcohol Use: Yes     Comment: rarely  . Drug Use: No  . Sexual Activity: Not on file   Other Topics Concern  . Not on file   Social History Narrative   Divorced   Print production plannerffice Manager   Two children, daughter and son    No Known Allergies   Constitutional: Positive headache, fatigue. Denies fever or abrupt weight changes.  HEENT:  Positive runny nose, sore throat. Denies eye redness, eye pain, pressure behind the eyes, facial pain, nasal congestion, ear pain, ringing in the ears, wax buildup, or bloody nose. Respiratory: Positive cough. Denies difficulty breathing or shortness of breath.  Cardiovascular: Denies chest pain, chest tightness, palpitations or swelling in the hands or feet.   No other specific complaints in a complete review  of systems (except as listed in HPI above).  Objective:   BP 128/76 mmHg  Pulse 97  Temp(Src) 98.4 F (36.9 C) (Oral)  Wt   SpO2 98% Wt Readings from Last 3 Encounters:  03/21/15 202 lb (91.627 kg)  11/04/14 209 lb 4 oz (94.915 kg)  11/18/13 209 lb 8 oz (95.029 kg)     General: Appears her stated age, in NAD. HEENT: Head: normal shape and size, no sinus tenderness noted; Eyes: sclera white, no icterus, conjunctiva pink; Ears: Tm's gray and intact, normal light reflex; Nose: mucosa pink and moist, septum midline; Throat/Mouth: + PND. Teeth present, mucosa pink and moist, no exudate noted, no lesions or ulcerations noted.  Neck: No cervical lymphadenopathy.  Cardiovascular: Normal rate and rhythm. S1,S2 noted.  No murmur, rubs or gallops noted.  Pulmonary/Chest: Normal effort and positive vesicular breath sounds. No respiratory distress. No wheezes, rales or ronchi noted.      Assessment & Plan:   Viral Upper Respiratory Infection with Cough:  Rapid flu: negative Get some rest and drink plenty of water Do salt water gargles for the sore throat Ibuprofen for inflammation/body aches  RTC as needed or if symptoms persist.

## 2015-04-20 ENCOUNTER — Telehealth: Payer: Self-pay

## 2015-04-20 NOTE — Telephone Encounter (Signed)
[facility] - Client TELEPHONE ADVICE RECORD [facility]  Patient Name: Crystal Hardin  DOB: 04/21/1950    Initial Comment Caller states she was in office Wed, neg flu, viral, has had diarrhea since   Nurse Assessment  Nurse: Johnson, RN, Gail Date/Time (Eastern Time): 04/20/2015 10:08:52 AM  Confirm and document reason for call. If symptomatic, describe symptoms. You must click the next button to save text entered. ---[name] was seen in office Wednesday negative for flu/strep but has had diarrhea since then and it is everything that passes her mouth -- every time she eats runs right through her incontinence of bm  Has the patient traveled out of the country within the last 30 days? ---No  Does the patient have any new or worsening symptoms? ---Yes  Will a triage be completed? ---Yes  Related visit to physician within the last 2 weeks? ---Yes  Does the PT have any chronic conditions? (i.e. diabetes, asthma, etc.) ---Unknown  Is this a behavioral health or substance abuse call? ---No     Guidelines    Guideline Title Affirmed Question Affirmed Notes  Diarrhea [1] SEVERE diarrhea (e.g., 7 or more times / day more than normal) AND [2] age > 60 years    Final Disposition User   See Physician within 4 Hours (or PCP triage) Johnson, RN, Gail    Comments  NOTE; has 4 hour outcome wants to see HER MD staes she is drinking plenty of water so not dehydrated and feels she can wait to see MD tomorrow appt; given 3/14-2017 900am Aron, Talia R.o viral   Referrals  REFERRED TO PCP OFFICE   Disagree/Comply: Comply      

## 2015-04-20 NOTE — Telephone Encounter (Signed)
Pt has appt 04/21/15 at 9AM with Dr Aron. 

## 2015-04-21 ENCOUNTER — Ambulatory Visit (INDEPENDENT_AMBULATORY_CARE_PROVIDER_SITE_OTHER): Payer: 59 | Admitting: Family Medicine

## 2015-04-21 ENCOUNTER — Encounter: Payer: Self-pay | Admitting: Family Medicine

## 2015-04-21 VITALS — BP 132/76 | HR 99 | Temp 97.7°F | Wt 201.0 lb

## 2015-04-21 DIAGNOSIS — J069 Acute upper respiratory infection, unspecified: Secondary | ICD-10-CM

## 2015-04-21 DIAGNOSIS — R1110 Vomiting, unspecified: Secondary | ICD-10-CM | POA: Diagnosis not present

## 2015-04-21 DIAGNOSIS — R197 Diarrhea, unspecified: Secondary | ICD-10-CM | POA: Diagnosis not present

## 2015-04-21 MED ORDER — PROMETHAZINE HCL 12.5 MG PO TABS: 12.5000 mg | ORAL_TABLET | Freq: Three times a day (TID) | ORAL | Status: DC | PRN

## 2015-04-21 NOTE — Assessment & Plan Note (Signed)
Resolving. Lung exam reassuring. No further rx indicated at this time.

## 2015-04-21 NOTE — Progress Notes (Signed)
Subjective:   Patient ID: Crystal Hardin, female    DOB: Jan 19, 1951, 65 y.o.   MRN: [id number]  Crystal Hardin is a pleasant 65 y.o. year old female who presents to clinic today with Diarrhea and Nausea  on 04/21/2015  HPI:   Was seen by my partner, Nicki Reaper, last week for URI symptoms. Note reviewed from 04/15/15- rapid flu negative. Exam reassuring, was told likely viral and advised supportive care.  Here today because she has had "non stop" nausea and diarrhea since she was seen.  No fever or vomiting. Congestion, cough and sinus pressure have resolved. No fever since last week.  Has already four watery BMs today.  Some mucous, no blood.  Current Outpatient Prescriptions on File Prior to Visit  Medication Sig Dispense Refill  . amLODipine (NORVASC) 5 MG tablet TAKE 1 TABLET (5 MG TOTAL) BY MOUTH DAILY. 90 tablet 3  . atorvastatin (LIPITOR) 20 MG tablet Take 1 tablet (20 mg total) by mouth daily. 90 tablet 3  . fluticasone (FLONASE) 50 MCG/ACT nasal spray Place 2 sprays into both nostrils daily. 16 g 6  . HYDROcodone-acetaminophen (NORCO/VICODIN) 5-325 MG tablet Take 1 tablet by mouth every 4 (four) hours as needed for moderate pain. 20 tablet 0  . montelukast (SINGULAIR) 10 MG tablet TAKE 1 TABLET (10 MG TOTAL) BY MOUTH DAILY. 90 tablet 3  . NEXIUM 40 MG capsule TAKE 1 CAPSULE (40 MG TOTAL) BY MOUTH DAILY BEFORE BREAKFAST. 90 capsule 1   No current facility-administered medications on file prior to visit.    No Known Allergies  Past Medical History  Diagnosis Date  . Hypertension   . Hyperchloremia     Past Surgical History  Procedure Laterality Date  . Vaginal hysterectomy      left ovary intact, DUB    Family History  Problem Relation Age of Onset  . Hyperlipidemia Mother   . Hypertension Mother   . Cancer Mother     breast  . Arthritis Mother   . Heart disease Father   . Emphysema Father   . Hypertension Sister   . Hypertension Brother   .  Dementia Maternal Aunt     Alzheimers  . Dementia Maternal Aunt     Alzheimers    Social History   Social History  . Marital Status: Single    Spouse Name: N/A  . Number of Children: 2  . Years of Education: N/A   Occupational History  . Print production planner    Social History Main Topics  . Smoking status: Never Smoker   . Smokeless tobacco: Never Used  . Alcohol Use: Yes     Comment: rarely  . Drug Use: No  . Sexual Activity: Not on file   Other Topics Concern  . Not on file   Social History Narrative   Divorced   Print production planner   Two children, daughter and son   The PMH, PSH, Social History, Family History, Medications, and allergies have been reviewed in [facility], and have been updated if relevant.   Review of Systems  Constitutional: Positive for appetite change and fatigue. Negative for fever.  HENT: Negative.   Respiratory: Positive for cough. Negative for shortness of breath and stridor.   Cardiovascular: Negative.   Gastrointestinal: Positive for nausea and diarrhea. Negative for vomiting, constipation, blood in stool, abdominal distention, anal bleeding and rectal pain.  Genitourinary: Negative.   Musculoskeletal: Negative.   Skin: Negative.   Neurological: Negative.   All other  systems reviewed and are negative.      Objective:    BP 132/76 mmHg  Pulse 99  Temp(Src) 97.7 F (36.5 C) (Oral)  Wt 201 lb (91.173 kg)  SpO2 94% Wt Readings from Last 3 Encounters:  04/21/15 201 lb (91.173 kg)  03/21/15 202 lb (91.627 kg)  11/04/14 209 lb 4 oz (94.915 kg)     Physical Exam  Constitutional: She is oriented to person, place, and time. She appears well-developed and well-nourished. No distress.  HENT:  Head: Atraumatic.  Eyes: Conjunctivae are normal.  Cardiovascular: Regular rhythm.   Pulmonary/Chest: Effort normal and breath sounds normal. No respiratory distress. She has no wheezes.  Abdominal: Soft. She exhibits no distension and no mass. There is  tenderness. There is no rebound and no guarding.  Neurological: She is alert and oriented to person, place, and time. No cranial nerve deficit.  Skin: Skin is warm and dry. She is not diaphoretic.  Psychiatric: She has a normal mood and affect. Her behavior is normal. Judgment and thought content normal.  Nursing note and vitals reviewed.         Assessment & Plan:   Acute upper respiratory infection  Vomiting and diarrhea No Follow-up on file.

## 2015-04-21 NOTE — Patient Instructions (Signed)
Great to see you. I still think this is viral.  Take the phenergan as needed for nausea and then try to get something in your belly- gatorade, soft foods- toast, bananas.  Keep us updated.

## 2015-04-21 NOTE — Progress Notes (Signed)
Pre visit review using our clinic review tool, if applicable. No additional management support is needed unless otherwise documented below in the visit note. 

## 2015-04-21 NOTE — Assessment & Plan Note (Signed)
New and likely part of viral process. eRx sent for phenergan to use as needed for nausea, push fluids.  Try to eat some foods.  See AVS. Call or return to clinic prn if these symptoms worsen or fail to improve as anticipated. The patient indicates understanding of these issues and agrees with the plan.

## 2015-09-01 ENCOUNTER — Ambulatory Visit (INDEPENDENT_AMBULATORY_CARE_PROVIDER_SITE_OTHER): Payer: 59 | Admitting: Family Medicine

## 2015-09-01 ENCOUNTER — Encounter: Payer: Self-pay | Admitting: Family Medicine

## 2015-09-01 VITALS — BP 122/72 | HR 77 | Temp 97.8°F | Wt 199.0 lb

## 2015-09-01 DIAGNOSIS — K529 Noninfective gastroenteritis and colitis, unspecified: Secondary | ICD-10-CM

## 2015-09-01 MED ORDER — ONDANSETRON HCL 4 MG PO TABS: 4.0000 mg | ORAL_TABLET | Freq: Three times a day (TID) | ORAL | 0 refills | Status: DC | PRN

## 2015-09-01 NOTE — Progress Notes (Signed)
(  S) Crystal Hardin is a 65 y.o. female with complaint of gastrointestinal symptoms of watery diarrhea, nausea for 1 day. No blood in stool.  Current Outpatient Prescriptions on File Prior to Visit  Medication Sig Dispense Refill  . amLODipine (NORVASC) 5 MG tablet TAKE 1 TABLET (5 MG TOTAL) BY MOUTH DAILY. 90 tablet 3  . atorvastatin (LIPITOR) 20 MG tablet Take 1 tablet (20 mg total) by mouth daily. 90 tablet 3  . fluticasone (FLONASE) 50 MCG/ACT nasal spray Place 2 sprays into both nostrils daily. 16 g 6  . HYDROcodone-acetaminophen (NORCO/VICODIN) 5-325 MG tablet Take 1 tablet by mouth every 4 (four) hours as needed for moderate pain. 20 tablet 0  . montelukast (SINGULAIR) 10 MG tablet TAKE 1 TABLET (10 MG TOTAL) BY MOUTH DAILY. 90 tablet 3  . NEXIUM 40 MG capsule TAKE 1 CAPSULE (40 MG TOTAL) BY MOUTH DAILY BEFORE BREAKFAST. 90 capsule 1  . promethazine (PHENERGAN) 12.5 MG tablet Take 1 tablet (12.5 mg total) by mouth every 8 (eight) hours as needed for nausea or vomiting. 20 tablet 0   No current facility-administered medications on file prior to visit.     No Known Allergies  Past Medical History:  Diagnosis Date  . Hyperchloremia   . Hypertension     Past Surgical History:  Procedure Laterality Date  . VAGINAL HYSTERECTOMY     left ovary intact, DUB    Family History  Problem Relation Age of Onset  . Hyperlipidemia Mother   . Hypertension Mother   . Cancer Mother     breast  . Arthritis Mother   . Heart disease Father   . Emphysema Father   . Hypertension Sister   . Hypertension Brother   . Dementia Maternal Aunt     Alzheimers  . Dementia Maternal Aunt     Alzheimers    Social History   Social History  . Marital status: Single    Spouse name: N/A  . Number of children: 2  . Years of education: N/A   Occupational History  . Print production planner    Social History Main Topics  . Smoking status: Never Smoker  . Smokeless tobacco: Never Used  . Alcohol use  Yes     Comment: rarely  . Drug use: No  . Sexual activity: Not on file   Other Topics Concern  . Not on file   Social History Narrative   Divorced   Print production planner   Two children, daughter and son   The PMH, PSH, Social History, Family History, Medications, and allergies have been reviewed in [facility], and have been updated if relevant.  (O)  BP 122/72   Pulse 77   Temp 97.8 F (36.6 C) (Oral)   Wt 199 lb (90.3 kg)   SpO2 98%   BMI 35.25 kg/m   Physical exam reveals the patient appears well. Hydration status: well hydrated. Abdomen: abdomen is soft without significant tenderness, masses, organomegaly or guarding..  (A) Viral Gastroenteritis  (P) I have recommended small amounts clear fluids frequently, soups, juices, water and advance diet as tolerated. Return office visit if symptoms persist or worsen; I have alerted the patient to call if high fever, dehydration, marked weakness, fainting, increased abdominal pain, blood in stool or vomit.

## 2015-09-01 NOTE — Patient Instructions (Signed)

## 2015-09-01 NOTE — Progress Notes (Signed)
Pre visit review using our clinic review tool, if applicable. No additional management support is needed unless otherwise documented below in the visit note. 

## 2015-11-24 ENCOUNTER — Other Ambulatory Visit: Payer: Self-pay | Admitting: Family Medicine

## 2015-11-26 ENCOUNTER — Other Ambulatory Visit: Payer: Self-pay | Admitting: Family Medicine

## 2015-12-14 ENCOUNTER — Telehealth: Payer: Self-pay | Admitting: Family Medicine

## 2015-12-14 NOTE — Telephone Encounter (Signed)
Patient Name: Crystal HawthorneDIANNA Hardin  DOB: 07/07/1950    Initial Comment got hives on saturday, been taking benedryal which is helping and hives are going away some. few patches here and there, needs to know if there is anything else she should be doing.    Nurse Assessment  Nurse: Stefano GaulStringer, RN, Dwana CurdVera Date/Time (Eastern Time): 12/14/2015 9:55:37 AM  Confirm and document reason for call. If symptomatic, describe symptoms. You must click the next button to save text entered. ---Caller states she got hives on Saturday. She started Benadryl on Saturday. Hives are improving. She has a few patches left. No new meds and no new foods. No fever.  Has the patient traveled out of the country within the last 30 days? ---Not Applicable  Does the patient have any new or worsening symptoms? ---Yes  Will a triage be completed? ---Yes  Related visit to physician within the last 2 weeks? ---No  Does the PT have any chronic conditions? (i.e. diabetes, asthma, etc.) ---No  Is this a behavioral health or substance abuse call? ---No     Guidelines    Guideline Title Affirmed Question Affirmed Notes  Hives Widespread hives (all triage questions negative)    Final Disposition User   Home Care FlorenceStringer, RN, Dwana CurdVera    Comments  called primary number and line was busy. Will try secondary number.   Disagree/Comply: Comply

## 2015-12-14 NOTE — Telephone Encounter (Signed)
Please call to check on pt. 

## 2015-12-14 NOTE — Telephone Encounter (Signed)
PLEASE NOTE: All timestamps contained within this report are represented as [place]. CONFIDENTIALTY NOTICE: This fax transmission is intended only for the addressee. It contains information that is legally privileged, confidential or otherwise protected from use or disclosure. If you are not the intended recipient, you are strictly prohibited from reviewing, disclosing, copying using or disseminating any of this information or taking any action in reliance on or regarding this information. If you have received this fax in error, please notify us immediately by telephone so that we can arrange for its return to us. Phone: [phone], Toll-Free: [phone], Fax: [phone] Page: 1 of 2 Call Id: [id number] [facility] Client TELEPHONE ADVICE RECORD [facility] Patient Name: Crystal Hardin Gender: Female DOB: 08/07/1950 Age: [age] Y 7 M 4 D Return Phone Number: [phone] (Primary), [phone] (Secondary) Address: [place]: [place] [id number] Client [place] [facility] Day Physician Ruthe MannanAron, Talia - MD Contact Type Call Who Is Calling Patient / Member / Family / Caregiver Call Type Triage / Clinical Relationship To Patient Self Return Phone Number [phone] (Primary) Chief Complaint Hives Reason for Call Symptomatic / Request for Health Information Initial Comment got hives on saturday, been taking benedryal which is helping and hives are going away some. few patches here and there, needs to know if there is anything else she should be doing. Appointment Disposition EMR Appointment Not Necessary Info pasted into Epic Yes PreDisposition Call Doctor Translation No Nurse Assessment Nurse: Stefano GaulStringer, RN, Dwana CurdVera Date/Time Lamount Cohen(Eastern Time): 12/14/2015 9:55:37 AM Confirm and document reason for call. If symptomatic, describe symptoms. You must click the  next button to save text entered. ---Caller states she got hives on Saturday. She started Benadryl on Saturday. Hives are improving. She has a few patches left. No new meds and no new foods. No fever. Has the patient traveled out of the country within the last 30 days? ---Not Applicable Does the patient have any new or worsening symptoms? ---Yes Will a triage be completed? ---Yes Related visit to physician within the last 2 weeks? ---No Does the PT have any chronic conditions? (i.e. diabetes, asthma, etc.) ---No Is this a behavioral health or substance abuse call? ---No Guidelines Guideline Title Affirmed Question Affirmed Notes Nurse Date/Time Lamount Cohen(Eastern Time) Hives Widespread hives (all triage questions negative) Stefano GaulStringer, RN, Dwana CurdVera 12/14/2015 9:57:20 AM Disp. Time Lamount Cohen(Eastern Time) Disposition Final User 12/14/2015 9:53:41 AM Attempt made - line busy Stefano GaulStringer, RN, Dwana CurdVera 12/14/2015 9:59:48 AM Home Care Yes Stefano GaulStringer, RN, Dwana CurdVera PLEASE NOTE: All timestamps contained within this report are represented as [place] Standard Time. CONFIDENTIALTY NOTICE: This fax transmission is intended only for the addressee. It contains information that is legally privileged, confidential or otherwise protected from use or disclosure. If you are not the intended recipient, you are strictly prohibited from reviewing, disclosing, copying using or disseminating any of this information or taking any action in reliance on or regarding this information. If you have received this fax in error, please notify us immediately by telephone so that we can arrange for its return to us. Phone: [phone], Toll-Free: [phone], Fax: [phone] Page: 2 of 2 Call Id: [id number] Caller Understands: Yes Disagree/Comply: Comply Care Advice Given Per Guideline HOME CARE: You should be able to treat this at home. REASSURANCE: It sounds like mild hives. They are usually part of a viral infection. CALL BACK IF: * Severe hives or  severe itching persist over  24 hours despite taking an antihistamine (e.g., Benadryl) * Hives last over 1 week * You become worse. CARE ADVICE given per Hives (Adult) guideline. * If you do not have cetrizine or loratadine, you can instead take diphendyramine (e.g., OTC Benadryl 25-50 mg PO) four times a day for hives that itch. Comments User: Art BuffVera, Stringer, RN Date/Time Lamount Cohen(Eastern Time): 12/14/2015 9:53:27 AM called primary number and line was busy. Will try secondary number.

## 2015-12-14 NOTE — Telephone Encounter (Signed)
Lm on pts vm requesting a call back with update of status 

## 2015-12-14 NOTE — Telephone Encounter (Signed)
Pt left v/m returning call; pt is feeling OK today but still itching slightly. cb if needed.

## 2015-12-24 ENCOUNTER — Other Ambulatory Visit: Payer: Self-pay | Admitting: Family Medicine

## 2015-12-30 ENCOUNTER — Ambulatory Visit (INDEPENDENT_AMBULATORY_CARE_PROVIDER_SITE_OTHER): Payer: 59 | Admitting: Family Medicine

## 2015-12-30 ENCOUNTER — Encounter: Payer: Self-pay | Admitting: Family Medicine

## 2015-12-30 VITALS — BP 120/60 | HR 96 | Temp 97.6°F | Wt 202.5 lb

## 2015-12-30 DIAGNOSIS — I10 Essential (primary) hypertension: Secondary | ICD-10-CM | POA: Diagnosis not present

## 2015-12-30 DIAGNOSIS — K2100 Gastro-esophageal reflux disease with esophagitis, without bleeding: Secondary | ICD-10-CM

## 2015-12-30 DIAGNOSIS — E7800 Pure hypercholesterolemia, unspecified: Secondary | ICD-10-CM

## 2015-12-30 LAB — COMPREHENSIVE METABOLIC PANEL
ALK PHOS: 70 U/L (ref 39–117)
ALT: 17 U/L (ref 0–35)
AST: 17 U/L (ref 0–37)
Albumin: 4.3 g/dL (ref 3.5–5.2)
BILIRUBIN TOTAL: 0.5 mg/dL (ref 0.2–1.2)
BUN: 13 mg/dL (ref 6–23)
CALCIUM: 9.8 mg/dL (ref 8.4–10.5)
CO2: 28 mEq/L (ref 19–32)
CREATININE: 0.73 mg/dL (ref 0.40–1.20)
Chloride: 104 mEq/L (ref 96–112)
GFR: 85.14 mL/min (ref >60.00)
Glucose, Bld: 103 mg/dL — ABNORMAL HIGH (ref 70–99)
Potassium: 3.4 mEq/L — ABNORMAL LOW (ref 3.5–5.1)
Sodium: 141 mEq/L (ref 135–145)
TOTAL PROTEIN: 7.3 g/dL (ref 6.0–8.3)

## 2015-12-30 LAB — LIPID PANEL
Cholesterol: 170 mg/dL (ref 0–200)
HDL: 61.7 mg/dL (ref >39.00)
LDL Cholesterol: 84 mg/dL (ref 0–99)
NONHDL: 107.93
TRIGLYCERIDES: 121 mg/dL (ref 0.0–149.0)
Total CHOL/HDL Ratio: 3
VLDL: 24.2 mg/dL (ref 0.0–40.0)

## 2015-12-30 MED ORDER — MONTELUKAST SODIUM 10 MG PO TABS: ORAL_TABLET | ORAL | 2 refills | Status: DC

## 2015-12-30 MED ORDER — ATORVASTATIN CALCIUM 20 MG PO TABS: 20.0000 mg | ORAL_TABLET | Freq: Every day | ORAL | 2 refills | Status: DC

## 2015-12-30 MED ORDER — AMLODIPINE BESYLATE 5 MG PO TABS
ORAL_TABLET | ORAL | 1 refills | Status: DC
Start: 2015-12-30 — End: 2016-06-09

## 2015-12-30 NOTE — Progress Notes (Signed)
Subjective:   Patient ID: Crystal Hardin, female    DOB: 11/19/1950, 65 y.o.   MRN: [id number]  Crystal Hardin is a pleasant 65 y.o. year old female who presents to clinic today with Follow-up  on 12/30/2015  HPI:  HLD- Has been well controlled with lipitor 20 mg daily.  Due for labs.  Lab Results  Component Value Date   CHOL 162 11/04/2014   HDL 52.90 11/04/2014   LDLCALC 83 11/04/2014   LDLDIRECT 162.7 08/09/2012   TRIG 132.0 11/04/2014   CHOLHDL 3 11/04/2014   HTN- has been controlled on Norvasc 5 mg daily. Denies HA, blurred vision, CP, SOB or LE edema.  Lab Results  Component Value Date   CREATININE 0.73 11/04/2014   GERD- continues to take Nexium 40 mg daily.  Current Outpatient Prescriptions on File Prior to Visit  Medication Sig Dispense Refill  . fluticasone (FLONASE) 50 MCG/ACT nasal spray Place 2 sprays into both nostrils daily. 16 g 6  . NEXIUM 40 MG capsule TAKE 1 CAPSULE (40 MG TOTAL) BY MOUTH DAILY BEFORE BREAKFAST. 90 capsule 1   No current facility-administered medications on file prior to visit.     No Known Allergies  Past Medical History:  Diagnosis Date  . Hyperchloremia   . Hypertension     Past Surgical History:  Procedure Laterality Date  . VAGINAL HYSTERECTOMY     left ovary intact, DUB    Family History  Problem Relation Age of Onset  . Hyperlipidemia Mother   . Hypertension Mother   . Cancer Mother     breast  . Arthritis Mother   . Heart disease Father   . Emphysema Father   . Hypertension Sister   . Hypertension Brother   . Dementia Maternal Aunt     Alzheimers  . Dementia Maternal Aunt     Alzheimers    Social History   Social History  . Marital status: Single    Spouse name: N/A  . Number of children: 2  . Years of education: N/A   Occupational History  . Print production plannerffice Manager    Social History Main Topics  . Smoking status: Never Smoker  . Smokeless tobacco: Never Used  . Alcohol use Yes     Comment:  rarely  . Drug use: No  . Sexual activity: Not on file   Other Topics Concern  . Not on file   Social History Narrative   Divorced   Print production plannerffice Manager   Two children, daughter and son   The PMH, PSH, Social History, Family History, Medications, and allergies [facility], and have been updated if relevant.  Review of Systems  Constitutional: Negative.   HENT: Negative.   Eyes: Negative.   Respiratory: Negative.   Cardiovascular: Negative.   Gastrointestinal: Negative.   Endocrine: Negative.   Genitourinary: Negative.   Musculoskeletal: Negative.   Skin: Negative.   Allergic/Immunologic: Negative.   Neurological: Negative.   Hematological: Negative.   Psychiatric/Behavioral: Negative.   All other systems reviewed and are negative.      Objective:    BP 120/60   Pulse 96   Temp 97.6 F (36.4 C) (Oral)   Wt 202 lb 8 oz (91.9 kg)   SpO2 97%   BMI 35.87 kg/m    Physical Exam  Constitutional: She is oriented to person, place, and time. She appears well-developed and well-nourished. No distress.  HENT:  Head: Normocephalic and atraumatic.  Eyes: Conjunctivae are normal.  Neck: Neck supple.  Cardiovascular: Normal rate and regular rhythm.   Pulmonary/Chest: Effort normal and breath sounds normal.  Musculoskeletal: Normal range of motion.  Neurological: She is alert and oriented to person, place, and time. No cranial nerve deficit.  Skin: Skin is warm and dry. She is not diaphoretic.  Psychiatric: She has a normal mood and affect. Her behavior is normal. Judgment and thought content normal.  Nursing note and vitals reviewed.         Assessment & Plan:   ESOPHAGITIS, REFLUX  Essential hypertension  HYPERCHOLESTEROLEMIA No Follow-up on file.

## 2015-12-30 NOTE — Assessment & Plan Note (Signed)
Well controlled with nexium , she is going to wean off of this.

## 2015-12-30 NOTE — Progress Notes (Signed)
Pre visit review using our clinic review tool, if applicable. No additional management support is needed unless otherwise documented below in the visit note. 

## 2015-12-30 NOTE — Assessment & Plan Note (Signed)
Well controlled. No changes made to rxs. 

## 2015-12-30 NOTE — Assessment & Plan Note (Signed)
Continue current dose lipitor. Check labs today.

## 2015-12-30 NOTE — Patient Instructions (Signed)
Great to see you. Happy Holidays.   

## 2016-04-11 ENCOUNTER — Encounter: Payer: Self-pay | Admitting: Family Medicine

## 2016-04-11 ENCOUNTER — Ambulatory Visit (INDEPENDENT_AMBULATORY_CARE_PROVIDER_SITE_OTHER)
Admission: RE | Admit: 2016-04-11 | Discharge: 2016-04-11 | Disposition: A | Discharge status: Home / Self Care | Payer: 59 | Source: Ambulatory Visit | Attending: Family Medicine | Admitting: Family Medicine

## 2016-04-11 ENCOUNTER — Ambulatory Visit (INDEPENDENT_AMBULATORY_CARE_PROVIDER_SITE_OTHER): Payer: 59 | Admitting: Family Medicine

## 2016-04-11 DIAGNOSIS — M79671 Pain in right foot: Secondary | ICD-10-CM

## 2016-04-11 LAB — COMPREHENSIVE METABOLIC PANEL
ALBUMIN: 4.3 g/dL (ref 3.5–5.2)
ALT: 19 U/L (ref 0–35)
AST: 15 U/L (ref 0–37)
Alkaline Phosphatase: 74 U/L (ref 39–117)
BUN: 15 mg/dL (ref 6–23)
CALCIUM: 9.7 mg/dL (ref 8.4–10.5)
CHLORIDE: 100 meq/L (ref 96–112)
CO2: 31 meq/L (ref 19–32)
Creatinine, Ser: 0.76 mg/dL (ref 0.40–1.20)
GFR: 81.2 mL/min (ref >60.00)
Glucose, Bld: 106 mg/dL — ABNORMAL HIGH (ref 70–99)
Potassium: 4.1 mEq/L (ref 3.5–5.1)
Sodium: 138 mEq/L (ref 135–145)
Total Bilirubin: 0.4 mg/dL (ref 0.2–1.2)
Total Protein: 7.5 g/dL (ref 6.0–8.3)

## 2016-04-11 LAB — URIC ACID: Uric Acid, Serum: 5.7 mg/dL (ref 2.4–7.0)

## 2016-04-11 NOTE — Patient Instructions (Signed)
Good to see you. I will call you with your xray results and your lab results.

## 2016-04-11 NOTE — Progress Notes (Signed)
Subjective:   Patient ID: Crystal Hardin, female    DOB: 08/09/1950, 66 y.o.   MRN: [id number]  Crystal Hardin is a pleasant 66 y.o. year old female who presents to clinic today with Foot Pain (Right foot intermittent. Gets worse each time.)  on 04/11/2016  HPI:  Right foot pain and swelling- intermittent for months. Sometimes so painful, even the sheet hurts. Has been warm and red and eventually goes away on its own.  Can move from one toe to another.  She is not sure what makes it better or worse.   Current Outpatient Prescriptions on File Prior to Visit  Medication Sig Dispense Refill  . amLODipine (NORVASC) 5 MG tablet TAKE 1 TABLET (5 MG TOTAL) BY MOUTH DAILY. 90 tablet 1  . atorvastatin (LIPITOR) 20 MG tablet Take 1 tablet (20 mg total) by mouth daily. 90 tablet 2  . fluticasone (FLONASE) 50 MCG/ACT nasal spray Place 2 sprays into both nostrils daily. 16 g 6  . montelukast (SINGULAIR) 10 MG tablet TAKE 1 TABLET (10 MG TOTAL) BY MOUTH DAILY. 90 tablet 2  . NEXIUM 40 MG capsule TAKE 1 CAPSULE (40 MG TOTAL) BY MOUTH DAILY BEFORE BREAKFAST. 90 capsule 1   No current facility-administered medications on file prior to visit.     No Known Allergies  Past Medical History:  Diagnosis Date  . Hyperchloremia   . Hypertension     Past Surgical History:  Procedure Laterality Date  . VAGINAL HYSTERECTOMY     left ovary intact, DUB    Family History  Problem Relation Age of Onset  . Hyperlipidemia Mother   . Hypertension Mother   . Cancer Mother     breast  . Arthritis Mother   . Heart disease Father   . Emphysema Father   . Hypertension Sister   . Hypertension Brother   . Dementia Maternal Aunt     Alzheimers  . Dementia Maternal Aunt     Alzheimers    Social History   Social History  . Marital status: Single    Spouse name: N/A  . Number of children: 2  . Years of education: N/A   Occupational History  . Print production plannerffice Manager    Social History Main Topics    . Smoking status: Never Smoker  . Smokeless tobacco: Never Used  . Alcohol use Yes     Comment: rarely  . Drug use: No  . Sexual activity: Not on file   Other Topics Concern  . Not on file   Social History Narrative   Divorced   Print production plannerffice Manager   Two children, daughter and son   The PMH, PSH, Social History, Family History, Medications, and allergies have been reviewed [facility], and have been updated if relevant.   Review of Systems  Constitutional: Negative.   Musculoskeletal: Positive for arthralgias.  Hematological: Negative.   Psychiatric/Behavioral: Negative.   All other systems reviewed and are negative.      Objective:    BP 126/70 (BP Location: Left Arm, Patient Position: Sitting, Cuff Size: Large)   Pulse 94   Temp 97.9 F (36.6 C) (Oral)   Wt 208 lb (94.3 kg)   SpO2 98%   BMI 36.85 kg/m    Physical Exam  Constitutional: She is oriented to person, place, and time. She appears well-developed and well-nourished. No distress.  HENT:  Head: Normocephalic and atraumatic.  Eyes: Conjunctivae are normal.  Cardiovascular: Normal rate.   Pulmonary/Chest: Effort normal.  Musculoskeletal:       Right foot: There is swelling. There is normal range of motion, no tenderness, no bony tenderness, normal capillary refill, no crepitus, no deformity and no laceration.  Neurological: She is alert and oriented to person, place, and time. No cranial nerve deficit.  Skin: Skin is warm and dry. She is not diaphoretic.  Psychiatric: She has a normal mood and affect. Her behavior is normal. Judgment and thought content normal.  Nursing note and vitals reviewed.         Assessment & Plan:   Right foot pain - Plan: DG Foot Complete Right, Uric Acid, Comprehensive metabolic panel No Follow-up on file.

## 2016-04-11 NOTE — Assessment & Plan Note (Signed)
Intermittent pain and warmth.  No warmth or erythema noted today. ?gout. Will check labs and xray today for further evaluation. The patient indicates understanding of these issues and agrees with the plan.

## 2016-04-11 NOTE — Progress Notes (Signed)
Pre visit review using our clinic review tool, if applicable. No additional management support is needed unless otherwise documented below in the visit note. 

## 2016-04-18 ENCOUNTER — Encounter: Payer: Self-pay | Admitting: Family Medicine

## 2016-04-18 ENCOUNTER — Ambulatory Visit (INDEPENDENT_AMBULATORY_CARE_PROVIDER_SITE_OTHER): Payer: 59 | Admitting: Family Medicine

## 2016-04-18 VITALS — BP 120/80 | HR 80 | Temp 97.6°F | Ht 64.0 in | Wt 207.0 lb

## 2016-04-18 DIAGNOSIS — M19079 Primary osteoarthritis, unspecified ankle and foot: Secondary | ICD-10-CM | POA: Diagnosis not present

## 2016-04-18 DIAGNOSIS — M1000 Idiopathic gout, unspecified site: Secondary | ICD-10-CM | POA: Diagnosis not present

## 2016-04-18 MED ORDER — COLCHICINE 0.6 MG PO CAPS: 1.0000 | ORAL_CAPSULE | Freq: Two times a day (BID) | ORAL | 2 refills | Status: DC

## 2016-04-18 MED ORDER — INDOMETHACIN 50 MG PO CAPS: 50.0000 mg | ORAL_CAPSULE | Freq: Three times a day (TID) | ORAL | 2 refills | Status: DC

## 2016-04-18 NOTE — Patient Instructions (Signed)

## 2016-04-18 NOTE — Progress Notes (Signed)
Dr. Karleen Hampshire T. Zaylia Riolo, MD, CAQ [facility] [address] [place], [address] Phone: [phone] Fax: [phone]  04/18/2016  Patient: Crystal Hardin, MRN: [id number], DOB: November 16, 1950, 66 y.o.  Primary Physician:  Ruthe Mannan, MD   Chief Complaint  Patient presents with  . Foot Pain    Right   Subjective:   [name] is a 66 y.o. very pleasant female patient who presents with the following:  Big toes, shooting pai and then went away, and had another spell in the first of January. All toes and about 10 days, got to the point where could not really stand it. R laterally - had some lateral pain. Deep throbbing.   She has had redness and warmth with excruciating pain with these episodes and unable to tolerate sheets or socks.   Now discreet episodes have resolved, but has had foot pain with extended walking for years.   H/o L 5th MT base fracture last year.   Past Medical History, Surgical History, Social History, Family History, Problem List, Medications, and Allergies have been reviewed and updated if relevant.  Patient Active Problem List   Diagnosis Date Noted  . Right foot pain 04/11/2016  . Nasal congestion 11/04/2014  . Polyarthralgia 07/18/2013  . Allergic reaction 03/04/2013  . OBESITY 12/19/2007  . ESOPHAGITIS, REFLUX 12/19/2007  . RHINITIS 01/01/2007  . HYPERCHOLESTEROLEMIA 12/15/2006  . Essential hypertension 12/04/2006    Past Medical History:  Diagnosis Date  . Hyperchloremia   . Hypertension     Past Surgical History:  Procedure Laterality Date  . VAGINAL HYSTERECTOMY     left ovary intact, DUB    Social History   Social History  . Marital status: Single    Spouse name: N/A  . Number of children: 2  . Years of education: N/A   Occupational History  . Print production planner    Social History Main Topics  . Smoking status: Never Smoker  . Smokeless tobacco: Never Used  . Alcohol use Yes   Comment: rarely  . Drug use: No  . Sexual activity: Not on file   Other Topics Concern  . Not on file   Social History Narrative   Divorced   Print production planner   Two children, daughter and son    Family History  Problem Relation Age of Onset  . Hyperlipidemia Mother   . Hypertension Mother   . Cancer Mother     breast  . Arthritis Mother   . Heart disease Father   . Emphysema Father   . Hypertension Sister   . Hypertension Brother   . Dementia Maternal Aunt     Alzheimers  . Dementia Maternal Aunt     Alzheimers    No Known Allergies  Medication list reviewed and updated in full in [facility].  GEN: No fevers, chills. Nontoxic. Primarily MSK c/o today. MSK: Detailed in the HPI GI: tolerating PO intake without difficulty Neuro: No numbness, parasthesias, or tingling associated. Otherwise the pertinent positives of the ROS are noted above.   Objective:   BP 120/80   Pulse 80   Temp 97.6 F (36.4 C) (Oral)   Ht 5\' 4"  (1.626 m)   Wt 207 lb (93.9 kg)   BMI 35.53 kg/m    GEN: WDWN, NAD, Non-toxic, Alert & Oriented x 3 HEENT: Atraumatic, Normocephalic.  Ears and Nose: No external deformity. EXTR: No clubbing/cyanosis/edema NEURO: Normal gait.  PSYCH: Normally interactive. Conversant.  Not depressed or anxious appearing.  Calm demeanor.   FEET: R Echymosis: no Edema: no ROM: full LE B Gait: heel toe, non-antalgic MT pain: no Callus pattern: none Lateral Mall: NT Medial Mall: NT Talus: NT Navicular: NT Cuboid: NT Calcaneous: NT Metatarsals: NT 5th MT: NT Phalanges: NT Achilles: NT Plantar Fascia: NT Fat Pad: NT Peroneals: NT Post Tib: NT Great Toe: Restricted motion on R Ant Drawer: neg ATFL: NT CFL: NT Deltoid: NT Other foot breakdown: none Long arch: preserved Transverse arch: preserved Hindfoot breakdown: none Sensation: intact   Radiology: Dg Foot Complete Right  Result Date: 04/11/2016 CLINICAL DATA:  Right foot pain EXAM:  RIGHT FOOT COMPLETE - 3+ VIEW COMPARISON:  None. FINDINGS: No fracture or malalignment. No osteopenia or bone erosion. Small plantar calcaneal spur. Mild degenerative changes at the first MTP joint IMPRESSION: 1. Mild degenerative changes at the first MTP joint 2. Small plantar calcaneal spur Electronically Signed   By: Jasmine Pang M.D.   On: 04/11/2016 14:32   Results for orders placed or performed in visit on 04/11/16  Uric Acid  Result Value Ref Range   Uric Acid, Serum 5.7 2.4 - 7.0 mg/dL  Comprehensive metabolic panel  Result Value Ref Range   Sodium 138 135 - 145 mEq/L   Potassium 4.1 3.5 - 5.1 mEq/L   Chloride 100 96 - 112 mEq/L   CO2 31 19 - 32 mEq/L   Glucose, Bld 106 (H) 70 - 99 mg/dL   BUN 15 6 - 23 mg/dL   Creatinine, Ser 4.09 0.40 - 1.20 mg/dL   Total Bilirubin 0.4 0.2 - 1.2 mg/dL   Alkaline Phosphatase 74 39 - 117 U/L   AST 15 0 - 37 U/L   ALT 19 0 - 35 U/L   Total Protein 7.5 6.0 - 8.3 g/dL   Albumin 4.3 3.5 - 5.2 g/dL   Calcium 9.7 8.4 - 81.1 mg/dL   GFR 91.47 >82.95 mL/min     Assessment and Plan:   Osteoarthritis of ankle and foot, unspecified laterality  Idiopathic gout, unspecified chronicity, unspecified site  Clinically highly suspicious for gout vs CPPD.  Normal blood uric acid does not exclude gout - and a high pretest probability.   Discussed footwear.  Indocin and colchicine prn gout attacks. Proph if needed.  Follow-up: No Follow-up on file.  Meds ordered this encounter  Medications  . indomethacin (INDOCIN) 50 MG capsule    Sig: Take 1 capsule (50 mg total) by mouth 3 (three) times daily with meals.    Dispense:  60 capsule    Refill:  2  . Colchicine (MITIGARE) 0.6 MG CAPS    Sig: Take 1 tablet by mouth 2 (two) times daily.    Dispense:  60 capsule    Refill:  2  . Bioflavonoid Products (BIOFLEX PO)    Sig: Take 1 tablet by mouth daily.   Patient Instructions  Low-Purine Diet Purines are compounds that affect the level of uric acid  in your body. A low-purine diet is a diet that is low in purines. Eating a low-purine diet can prevent the level of uric acid in your body from getting too high and causing gout or kidney stones or both. What do I need to know about this diet?  Choose low-purine foods. Examples of low-purine foods are listed in the next section.  Drink plenty of fluids, especially water. Fluids can help remove uric acid from your body. Try to drink 8-16 cups (1.9-3.8 L)  a day.  Limit foods high in fat, especially saturated fat, as fat makes it harder for the body to get rid of uric acid. Foods high in saturated fat include pizza, cheese, ice cream, whole milk, fried foods, and gravies. Choose foods that are lower in fat and lean sources of protein. Use olive oil when cooking as it contains healthy fats that are not high in saturated fat.  Limit alcohol. Alcohol interferes with the elimination of uric acid from your body. If you are having a gout attack, avoid all alcohol.  Keep in mind that different people's bodies react differently to different foods. You will probably learn over time which foods do or do not affect you. If you discover that a food tends to cause your gout to flare up, avoid eating that food. You can more freely enjoy foods that do not cause problems. If you have any questions about a food item, talk to your dietitian or health care provider. Which foods are low, moderate, and high in purines? The following is a list of foods that are low, moderate, and high in purines. You can eat any amount of the foods that are low in purines. You may be able to have small amounts of foods that are moderate in purines. Ask your health care provider how much of a food moderate in purines you can have. Avoid foods high in purines. Grains   Foods low in purines: Enriched white bread, pasta, rice, cake, cornbread, popcorn.  Foods moderate in purines: Whole-grain breads and cereals, wheat germ, bran, oatmeal.  Uncooked oatmeal. Dry wheat bran or wheat germ.  Foods high in purines: Pancakes, [place] toast, biscuits, muffins. Vegetables   Foods low in purines: All vegetables, except those that are moderate in purines.  Foods moderate in purines: Asparagus, cauliflower, spinach, mushrooms, green peas. Fruits   All fruits are low in purines. Meats and other Protein Foods   Foods low in purines: Eggs, nuts, peanut butter.  Foods moderate in purines: 80-90% lean beef, lamb, veal, pork, poultry, fish, eggs, peanut butter, nuts. Crab, lobster, oysters, and shrimp. Cooked dried beans, peas, and lentils.  Foods high in purines: Anchovies, sardines, herring, mussels, tuna, codfish, scallops, trout, and haddock. Tomasa BlaseBacon. Organ meats (such as liver or kidney). Tripe. Game meat. Goose. Sweetbreads. Dairy   All dairy foods are low in purines. Low-fat and fat-free dairy products are best because they are low in saturated fat. Beverages   Drinks low in purines: Water, carbonated beverages, tea, coffee, cocoa.  Drinks moderate in purines: Soft drinks and other drinks sweetened with high-fructose corn syrup. Juices. To find whether a food or drink is sweetened with high-fructose corn syrup, look at the ingredients list.  Drinks high in purines: Alcoholic beverages (such as beer). Condiments   Foods low in purines: Salt, herbs, olives, pickles, relishes, vinegar.  Foods moderate in purines: Butter, margarine, oils, mayonnaise. Fats and Oils   Foods low in purines: All types, except gravies and sauces made with meat.  Foods high in purines: Gravies and sauces made with meat. Other Foods   Foods low in purines: Sugars, sweets, gelatin. Cake. Soups made without meat.  Foods moderate in purines: Meat-based or fish-based soups, broths, or bouillons. Foods and drinks sweetened with high-fructose corn syrup.  Foods high in purines: High-fat desserts (such as ice cream, cookies, cakes, pies, doughnuts, and  chocolate). Contact your dietitian for more information on foods that are not listed here.  This information is not intended to  replace advice given to you by your health care provider. Make sure you discuss any questions you have with your health care provider. Document Released: 05/21/2010 Document Revised: 07/02/2015 Document Reviewed: 12/31/2012 Elsevier Interactive Patient Education  2017 [employer].     Signed,  [place] T. Enslee Bibbins, MD   Allergies as of 04/18/2016   No Known Allergies     Medication List       Accurate as of 04/18/16  9:08 AM. Always use your most recent med list.          amLODipine 5 MG tablet Commonly known as:  NORVASC TAKE 1 TABLET (5 MG TOTAL) BY MOUTH DAILY.   atorvastatin 20 MG tablet Commonly known as:  LIPITOR Take 1 tablet (20 mg total) by mouth daily.   BIOFLEX PO Take 1 tablet by mouth daily.   Colchicine 0.6 MG Caps Commonly known as:  MITIGARE Take 1 tablet by mouth 2 (two) times daily.   fluticasone 50 MCG/ACT nasal spray Commonly known as:  FLONASE Place 2 sprays into both nostrils daily.   indomethacin 50 MG capsule Commonly known as:  INDOCIN Take 1 capsule (50 mg total) by mouth 3 (three) times daily with meals.   montelukast 10 MG tablet Commonly known as:  SINGULAIR TAKE 1 TABLET (10 MG TOTAL) BY MOUTH DAILY.   NEXIUM 40 MG capsule Generic drug:  esomeprazole TAKE 1 CAPSULE (40 MG TOTAL) BY MOUTH DAILY BEFORE BREAKFAST.

## 2016-04-18 NOTE — Progress Notes (Signed)
Pre visit review using our clinic review tool, if applicable. No additional management support is needed unless otherwise documented below in the visit note. 

## 2016-06-06 ENCOUNTER — Encounter: Payer: Self-pay | Admitting: Emergency Medicine

## 2016-06-06 DIAGNOSIS — I10 Essential (primary) hypertension: Secondary | ICD-10-CM | POA: Insufficient documentation

## 2016-06-06 DIAGNOSIS — R109 Unspecified abdominal pain: Secondary | ICD-10-CM | POA: Insufficient documentation

## 2016-06-06 DIAGNOSIS — M546 Pain in thoracic spine: Secondary | ICD-10-CM | POA: Insufficient documentation

## 2016-06-06 DIAGNOSIS — Z79899 Other long term (current) drug therapy: Secondary | ICD-10-CM | POA: Diagnosis not present

## 2016-06-06 MED ORDER — ONDANSETRON 4 MG PO TBDP
4.0000 mg | ORAL_TABLET | Freq: Once | ORAL | Status: AC | PRN
  Administered 2016-06-06: 4 mg via ORAL
  Filled 2016-06-06: qty 1

## 2016-06-06 MED ORDER — OXYCODONE-ACETAMINOPHEN 5-325 MG PO TABS
1.0000 | ORAL_TABLET | ORAL | Status: AC | PRN
  Administered 2016-06-07 (×2): 1 via ORAL
  Filled 2016-06-06 (×2): qty 1

## 2016-06-06 NOTE — ED Triage Notes (Signed)
Patient to ER with left sided flank pain. Reports left abd pain last night, none currently. Denies dysuria.

## 2016-06-07 ENCOUNTER — Emergency Department: Payer: 59

## 2016-06-07 ENCOUNTER — Emergency Department
Admission: EM | Admit: 2016-06-07 | Discharge: 2016-06-07 | Disposition: A | Discharge status: Home / Self Care | Payer: 59 | Attending: Emergency Medicine | Admitting: Emergency Medicine

## 2016-06-07 DIAGNOSIS — M546 Pain in thoracic spine: Secondary | ICD-10-CM

## 2016-06-07 LAB — CBC
HCT: 38.7 % (ref 35.0–47.0)
HEMOGLOBIN: 13.2 g/dL (ref 12.0–16.0)
MCH: 28.7 pg (ref 26.0–34.0)
MCHC: 34.1 g/dL (ref 32.0–36.0)
MCV: 84.2 fL (ref 80.0–100.0)
PLATELETS: 304 10*3/uL (ref 150–440)
RBC: 4.6 MIL/uL (ref 3.80–5.20)
RDW: 12.2 % (ref 11.5–14.5)
WBC: 7 10*3/uL (ref 3.6–11.0)

## 2016-06-07 LAB — URINALYSIS, COMPLETE (UACMP) WITH MICROSCOPIC
Bilirubin Urine: NEGATIVE
GLUCOSE, UA: NEGATIVE mg/dL
KETONES UR: NEGATIVE mg/dL
Nitrite: NEGATIVE
PROTEIN: NEGATIVE mg/dL
Specific Gravity, Urine: 1.008 (ref 1.005–1.030)
pH: 6 (ref 5.0–8.0)

## 2016-06-07 LAB — COMPREHENSIVE METABOLIC PANEL
ALT: 19 U/L (ref 14–54)
ANION GAP: 8 (ref 5–15)
AST: 20 U/L (ref 15–41)
Albumin: 4.5 g/dL (ref 3.5–5.0)
Alkaline Phosphatase: 73 U/L (ref 38–126)
BUN: 16 mg/dL (ref 6–20)
CHLORIDE: 102 mmol/L (ref 101–111)
CO2: 29 mmol/L (ref 22–32)
CREATININE: 0.73 mg/dL (ref 0.44–1.00)
Calcium: 9.9 mg/dL (ref 8.9–10.3)
Glucose, Bld: 104 mg/dL — ABNORMAL HIGH (ref 65–99)
Potassium: 3.5 mmol/L (ref 3.5–5.1)
Sodium: 139 mmol/L (ref 135–145)
Total Bilirubin: 0.7 mg/dL (ref 0.3–1.2)
Total Protein: 8 g/dL (ref 6.5–8.1)

## 2016-06-07 LAB — LIPASE, BLOOD: LIPASE: 32 U/L (ref 11–51)

## 2016-06-07 LAB — FIBRIN DERIVATIVES D-DIMER (ARMC ONLY): FIBRIN DERIVATIVES D-DIMER (ARMC): 308.93 (ref 0.00–499.00)

## 2016-06-07 MED ORDER — ETODOLAC 200 MG PO CAPS
200.0000 mg | ORAL_CAPSULE | Freq: Once | ORAL | Status: DC
  Filled 2016-06-07: qty 1

## 2016-06-07 MED ORDER — ETODOLAC 200 MG PO CAPS: 200.0000 mg | ORAL_CAPSULE | Freq: Three times a day (TID) | ORAL | 0 refills | Status: DC

## 2016-06-07 NOTE — Discharge Instructions (Signed)
Please follow up with your primary care physician for further evaluation of this pain.

## 2016-06-07 NOTE — ED Provider Notes (Signed)
[facility] Emergency Department Provider Note   ____________________________________________   First MD Initiated Contact with Patient 06/07/16 0124     (approximate)  I have reviewed the triage vital signs and the nursing notes.   HISTORY  Chief Complaint Flank Pain    HPI Crystal Hardin is a 66 y.o. female who comes into the hospital today with some sharp pain in her left upper back. She reports that she felt bad yesterday and had some pain in her left upper stomach. She went to bed and then woke up at 11 PM with some difficulty breathing and pain when she took a deep inspiration. The patient didn't take anything for pain but reports the pain is currently 1-2 out of 10 in intensity. She was given Percocet when she arrived to the emergency department. She reports that she has had some lower back that she has arthritis its prolonged periods of time at work so she didn't think much of it. The patient denies chest pain but did have shortness of breath as it was to breathe. She had some nausea yesterday and didn't the patient was unsure what this pain was worse that she couldn't tolerate it so she decided to come to the hospital for further evaluation. She's had no pain with urination the patient has never had this before.   Past Medical History:  Diagnosis Date  . Hyperchloremia   . Hypertension     Patient Active Problem List   Diagnosis Date Noted  . Right foot pain 04/11/2016  . Nasal congestion 11/04/2014  . Polyarthralgia 07/18/2013  . Allergic reaction 03/04/2013  . OBESITY 12/19/2007  . ESOPHAGITIS, REFLUX 12/19/2007  . RHINITIS 01/01/2007  . HYPERCHOLESTEROLEMIA 12/15/2006  . Essential hypertension 12/04/2006    Past Surgical History:  Procedure Laterality Date  . KNEE SURGERY Right   . VAGINAL HYSTERECTOMY     left ovary intact, DUB    Prior to Admission medications   Medication Sig Start Date End Date Taking? Authorizing Provider   amLODipine (NORVASC) 5 MG tablet TAKE 1 TABLET (5 MG TOTAL) BY MOUTH DAILY. 12/30/15   Dianne Dun, MD  atorvastatin (LIPITOR) 20 MG tablet Take 1 tablet (20 mg total) by mouth daily. 12/30/15   Dianne Dun, MD  Bioflavonoid Products (BIOFLEX PO) Take 1 tablet by mouth daily.    Historical Provider, MD  Colchicine (MITIGARE) 0.6 MG CAPS Take 1 tablet by mouth 2 (two) times daily. 04/18/16   Hannah Beat, MD  etodolac (LODINE) 200 MG capsule Take 1 capsule (200 mg total) by mouth every 8 (eight) hours. 06/07/16   Rebecka Apley, MD  fluticasone (FLONASE) 50 MCG/ACT nasal spray Place 2 sprays into both nostrils daily. 10/21/13   Dianne Dun, MD  indomethacin (INDOCIN) 50 MG capsule Take 1 capsule (50 mg total) by mouth 3 (three) times daily with meals. 04/18/16   Spencer Copland, MD  montelukast (SINGULAIR) 10 MG tablet TAKE 1 TABLET (10 MG TOTAL) BY MOUTH DAILY. 12/30/15   Dianne Dun, MD  NEXIUM 40 MG capsule TAKE 1 CAPSULE (40 MG TOTAL) BY MOUTH DAILY BEFORE BREAKFAST. 03/02/13   Dianne Dun, MD    Allergies Patient has no known allergies.  Family History  Problem Relation Age of Onset  . Hyperlipidemia Mother   . Hypertension Mother   . Cancer Mother     breast  . Arthritis Mother   . Heart disease Father   . Emphysema Father   .  Hypertension Sister   . Hypertension Brother   . Dementia Maternal Aunt     Alzheimers  . Dementia Maternal Aunt     Alzheimers    Social History Social History  Substance Use Topics  . Smoking status: Never Smoker  . Smokeless tobacco: Never Used  . Alcohol use Yes     Comment: rarely    Review of Systems  Constitutional: No fever/chills Eyes: No visual changes. ENT: No sore throat. Cardiovascular: Denies chest pain. Respiratory: Denies shortness of breath. Gastrointestinal:  abdominal pain, nausea, no vomiting.  No diarrhea.  No constipation. Genitourinary: Negative for dysuria. Musculoskeletal: back pain. Skin: Negative for  rash. Neurological: Negative for headaches, focal weakness or numbness.   ____________________________________________   PHYSICAL EXAM:  VITAL SIGNS: ED Triage Vitals  Enc Vitals Group     BP 06/06/16 2350 (!) 135/93     Pulse Rate 06/06/16 2350 (!) 102     Resp 06/06/16 2350 20     Temp 06/06/16 2350 98.9 F (37.2 C)     Temp Source 06/06/16 2350 Oral     SpO2 06/06/16 2350 99 %     Weight 06/06/16 2351 207 lb (93.9 kg)     Height 06/06/16 2351 5' 3.5" (1.613 m)     Head Circumference --      Peak Flow --      Pain Score 06/06/16 2349 8     Pain Loc --      Pain Edu? --      Excl. in GC? --     Constitutional: Alert and oriented. Well appearing and in mild distress. Eyes: Conjunctivae are normal. PERRL. EOMI. Head: Atraumatic. Nose: No congestion/rhinnorhea. Mouth/Throat: Mucous membranes are moist.  Oropharynx non-erythematous. Cardiovascular: Normal rate, regular rhythm. Grossly normal heart sounds.  Good peripheral circulation. Respiratory: Normal respiratory effort.  No retractions. Lungs CTAB. Gastrointestinal: Soft and nontender. No distention. Positive bowel sounds Musculoskeletal: No lower extremity tenderness nor edema.   Neurologic:  Normal speech and language.  Skin:  Skin is warm, dry and intact. Marland Kitchen Psychiatric: Mood and affect are normal. .  ____________________________________________   LABS (all labs ordered are listed, but only abnormal results are displayed)  Labs Reviewed  COMPREHENSIVE METABOLIC PANEL - Abnormal; Notable for the following:       Result Value   Glucose, Bld 104 (*)    All other components within normal limits  URINALYSIS, COMPLETE (UACMP) WITH MICROSCOPIC - Abnormal; Notable for the following:    Color, Urine STRAW (*)    APPearance CLEAR (*)    Hgb urine dipstick SMALL (*)    Leukocytes, UA TRACE (*)    Bacteria, UA RARE (*)    Squamous Epithelial / LPF 0-5 (*)    All other components within normal limits  LIPASE, BLOOD   CBC  FIBRIN DERIVATIVES D-DIMER ([facility] ONLY)   ____________________________________________  EKG  none ____________________________________________  RADIOLOGY  CXR ____________________________________________   PROCEDURES  Procedure(s) performed: None  Procedures  Critical Care performed: No  ____________________________________________   INITIAL IMPRESSION / ASSESSMENT AND PLAN / ED COURSE  Pertinent labs & imaging results that were available during my care of the patient were reviewed by me and considered in my medical decision making (see chart for details).  This is a 66 year old female who comes into the hospital today with some left-sided back pain/flank pain. When I examined the patient was higher up in her ribs and not as much the patient's blood work is unremarkable  I will send the patient for a chest x-ray and performing d-dimer. The patient's pain is improved at this time I will reassess the patient once I received the results of the blood work.  Clinical Course as of Jun 07 753  Tue Jun 07, 2016  0352 1. No active cardiopulmonary disease. 2. 7 mm calcified granuloma within the right upper lobe.   DG Chest 2 View [AW]  0449 1. No acute findings are evident in the abdomen or pelvis. 2. Uncomplicated mild colonic diverticulosis. 3. Lower lumbar facet arthritis.   CT Renal [place] [AW]    Clinical Course User Index [AW] Rebecka Apley, MD    The patient's workup is unremarkable. She will be discharged home to follow-up with her primary care physician. ____________________________________________   FINAL CLINICAL IMPRESSION(S) / ED DIAGNOSES  Final diagnoses:  Acute left-sided thoracic back pain      NEW MEDICATIONS STARTED DURING THIS VISIT:  Discharge Medication List as of 06/07/2016  4:51 AM       Note:  This document was prepared using Dragon voice recognition software and may include unintentional dictation errors.    Rebecka Apley, MD 06/07/16 [id number]

## 2016-06-09 ENCOUNTER — Encounter: Payer: Self-pay | Admitting: Family Medicine

## 2016-06-09 ENCOUNTER — Ambulatory Visit (INDEPENDENT_AMBULATORY_CARE_PROVIDER_SITE_OTHER): Payer: 59 | Admitting: Family Medicine

## 2016-06-09 VITALS — BP 110/74 | HR 92 | Temp 97.8°F | Wt 207.0 lb

## 2016-06-09 DIAGNOSIS — K2100 Gastro-esophageal reflux disease with esophagitis, without bleeding: Secondary | ICD-10-CM

## 2016-06-09 DIAGNOSIS — M545 Low back pain: Secondary | ICD-10-CM

## 2016-06-09 MED ORDER — PANTOPRAZOLE SODIUM 40 MG PO TBEC: 40.0000 mg | DELAYED_RELEASE_TABLET | Freq: Two times a day (BID) | ORAL | 3 refills | Status: DC

## 2016-06-09 MED ORDER — AMLODIPINE BESYLATE 5 MG PO TABS: ORAL_TABLET | ORAL | 1 refills | Status: DC

## 2016-06-09 MED ORDER — ETODOLAC 200 MG PO CAPS: 200.0000 mg | ORAL_CAPSULE | Freq: Three times a day (TID) | ORAL | 3 refills | Status: DC

## 2016-06-09 NOTE — Assessment & Plan Note (Signed)
Deteriorated. D/c nexium. Start protonix twice daily. Call or return to clinic prn if these symptoms worsen or fail to improve as anticipated. The patient indicates understanding of these issues and agrees with the plan.

## 2016-06-09 NOTE — Assessment & Plan Note (Signed)
Improved with NSAID. eRx refilled per pt request but I did caution that this can worsen reflux and needs to always be taken with food. The patient indicates understanding of these issues and agrees with the plan.

## 2016-06-09 NOTE — Progress Notes (Signed)
Pre visit review using our clinic review tool, if applicable. No additional management support is needed unless otherwise documented below in the visit note. 

## 2016-06-09 NOTE — Patient Instructions (Signed)
Great to see you.  Please STOP taking Nexium.  Start taking Protonix 40 mg twice daily.

## 2016-06-09 NOTE — Progress Notes (Signed)
Subjective:   Patient ID: Crystal Hardin, female    DOB: 31-May-1950, 66 y.o.   MRN: [id number]  Crystal Hardin is a pleasant 66 y.o. year old female who presents to clinic today with ER Follow-up (Made this appt 4 days ago. Went to ER on 5-1. BAck and stomach pain is better.) and Gastroesophageal Reflux (really bad right now)  on 06/09/2016  HPI:  ER follow up-  Was seen in the ER on 06/08/15 for sharp upper left back pain that also radiated to her left stomach.  Notes reviewed.  D dimer, CXR and CT renal stone were all neg for acute findings.  Symptoms resolved while she was in the ER.  Dg Chest 2 View  Result Date: 06/07/2016 CLINICAL DATA:  Initial evaluation for acute left flank pain with shortness of breath. EXAM: CHEST  2 VIEW COMPARISON:  None. FINDINGS: Cardiac and mediastinal silhouettes within normal limits. Lungs normally inflated. No focal infiltrate. No pulmonary edema or pleural effusion. No pneumothorax. 7 mm calcification overlies the right upper lobe, likely granuloma. No acute osseus abnormality. IMPRESSION: 1. No active cardiopulmonary disease. 2. 7 mm calcified granuloma within the right upper lobe. Electronically Signed   By: Rise Mu M.D.   On: 06/07/2016 02:54   Ct Renal Stone Study  Result Date: 06/07/2016 CLINICAL DATA:  Awakened by left flank pain at 23:00. EXAM: CT ABDOMEN AND PELVIS WITHOUT CONTRAST TECHNIQUE: Multidetector CT imaging of the abdomen and pelvis was performed following the standard protocol without IV contrast. COMPARISON:  None. FINDINGS: Lower chest: No acute abnormality. Hepatobiliary: No focal liver abnormality is seen. No gallstones, gallbladder wall thickening, or biliary dilatation. Pancreas: Unremarkable. No pancreatic ductal dilatation or surrounding inflammatory changes. Spleen: The spleen is normal in size and morphology. Scattered splenic granulomata. Adrenals/Urinary Tract: Adrenal glands are unremarkable. Kidneys are normal,  without renal calculi, focal lesion, or hydronephrosis. Bladder is unremarkable. 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 Stomach/Bowel: Stomach is within normal limits. Appendix is normal. Uncomplicated mild colonic diverticulosis. No evidence of bowel wall thickening, distention, or inflammatory changes. Vascular/Lymphatic: No significant vascular findings are present. No enlarged abdominal or pelvic lymph nodes. Reproductive: Status post hysterectomy. No adnexal masses. Other: No focal inflammation.  No ascites. Musculoskeletal: No significant skeletal lesion. Moderately severe lower lumbar facet arthritis. IMPRESSION: 1. No acute findings are evident in the abdomen or pelvis. 2. Uncomplicated mild colonic diverticulosis. 3. Lower lumbar facet arthritis. Electronically Signed   By: Ellery Plunk M.D.   On: 06/07/2016 04:41   Given rx for etodolac which has helped tremendously with her arthritis.  She is asking for a refill of this but also feels her reflux is much worse.  Nexium is not as effective.  Waking up with burning in her throat almost every day.  Current Outpatient Prescriptions on File Prior to Visit  Medication Sig Dispense Refill  . atorvastatin (LIPITOR) 20 MG tablet Take 1 tablet (20 mg total) by mouth daily. 90 tablet 2  . indomethacin (INDOCIN) 50 MG capsule Take 1 capsule (50 mg total) by mouth 3 (three) times daily with meals. 60 capsule 2  . montelukast (SINGULAIR) 10 MG tablet TAKE 1 TABLET (10 MG TOTAL) BY MOUTH DAILY. 90 tablet 2   No current facility-administered medications on file prior to visit.     No Known Allergies  Past Medical  History:  Diagnosis Date  . Hyperchloremia   . Hypertension     Past Surgical History:  Procedure Laterality Date  . KNEE SURGERY Right   . VAGINAL HYSTERECTOMY     left ovary intact, DUB     Family History  Problem Relation Age of Onset  . Hyperlipidemia Mother   . Hypertension Mother   . Cancer Mother     breast  . Arthritis Mother   . Heart disease Father   . Emphysema Father   . Hypertension Sister   . Hypertension Brother   . Dementia Maternal Aunt     Alzheimers  . Dementia Maternal Aunt     Alzheimers    Social History   Social History  . Marital status: Single    Spouse name: N/A  . Number of children: 2  . Years of education: N/A   Occupational History  . Print production plannerffice Manager    Social History Main Topics  . Smoking status: Never Smoker  . Smokeless tobacco: Never Used  . Alcohol use Yes     Comment: rarely  . Drug use: No  . Sexual activity: Not on file   Other Topics Concern  . Not on file   Social History Narrative   Divorced   Print production plannerffice Manager   Two children, daughter and son   The PMH, PSH, Social History, Family History, Medications, and allergies have been reviewed [facility], and have been updated if relevant.   Review of Systems  Constitutional: Negative.   Respiratory: Negative.   Cardiovascular: Negative.   Gastrointestinal: Negative for abdominal distention and abdominal pain.       +GERD  Endocrine: Negative.   Genitourinary: Negative.   Musculoskeletal: Positive for back pain.  Allergic/Immunologic: Negative.   Neurological: Negative.   Hematological: Negative.   Psychiatric/Behavioral: Negative.   All other systems reviewed and are negative.      Objective:    BP 110/74 (BP Location: Left Arm, Patient Position: Sitting, Cuff Size: Large)   Pulse 92   Temp 97.8 F (36.6 C) (Oral)   Wt 207 lb (93.9 kg)   SpO2 98%   BMI 36.09 kg/m    Physical Exam  Constitutional: She is oriented to person, place, and time. She appears well-developed and well-nourished. No distress.  HENT:  Head: Normocephalic and atraumatic.  Eyes: Conjunctivae are normal.  Cardiovascular: Normal rate.   Pulmonary/Chest: Effort normal.   Musculoskeletal: Normal range of motion.  Neurological: She is alert and oriented to person, place, and time. No cranial nerve deficit.  Skin: Skin is warm and dry. She is not diaphoretic.  Psychiatric: She has a normal mood and affect. Her behavior is normal. Judgment and thought content normal.  Nursing note and vitals reviewed.         Assessment & Plan:   ESOPHAGITIS, REFLUX  Left low back pain, unspecified chronicity, with sciatica presence unspecified No Follow-up on file.

## 2016-06-26 ENCOUNTER — Other Ambulatory Visit: Payer: Self-pay | Admitting: Family Medicine

## 2016-09-25 ENCOUNTER — Other Ambulatory Visit: Payer: Self-pay | Admitting: Family Medicine

## 2016-10-11 ENCOUNTER — Other Ambulatory Visit: Payer: Self-pay | Admitting: Family Medicine

## 2016-11-30 ENCOUNTER — Other Ambulatory Visit: Payer: Self-pay | Admitting: Family Medicine

## 2016-12-08 ENCOUNTER — Other Ambulatory Visit: Payer: Self-pay

## 2016-12-08 ENCOUNTER — Other Ambulatory Visit: Payer: Self-pay | Admitting: Family Medicine

## 2017-01-23 ENCOUNTER — Other Ambulatory Visit: Payer: Self-pay | Admitting: Family Medicine

## 2017-01-23 NOTE — Telephone Encounter (Signed)
Last refill was advised that needs appt/denied/thx dmf

## 2017-02-22 ENCOUNTER — Other Ambulatory Visit: Payer: Self-pay

## 2017-02-22 MED ORDER — PANTOPRAZOLE SODIUM 40 MG PO TBEC: 40.0000 mg | DELAYED_RELEASE_TABLET | Freq: Two times a day (BID) | ORAL | 3 refills | Status: DC

## 2017-05-27 ENCOUNTER — Other Ambulatory Visit: Payer: Self-pay | Admitting: Family Medicine

## 2017-06-22 ENCOUNTER — Other Ambulatory Visit: Payer: Self-pay | Admitting: Family Medicine

## 2017-06-27 ENCOUNTER — Other Ambulatory Visit: Payer: Self-pay | Admitting: Family Medicine

## 2017-07-24 ENCOUNTER — Other Ambulatory Visit: Payer: Self-pay | Admitting: Family Medicine

## 2017-08-02 IMAGING — DX DG FOOT COMPLETE 3+V*R*
3 series · 3 of 3 positions shown · non-contrast
Comparison: None.

CLINICAL DATA: Right foot pain

EXAM:
RIGHT FOOT COMPLETE - 3+ VIEW

[foot ap]
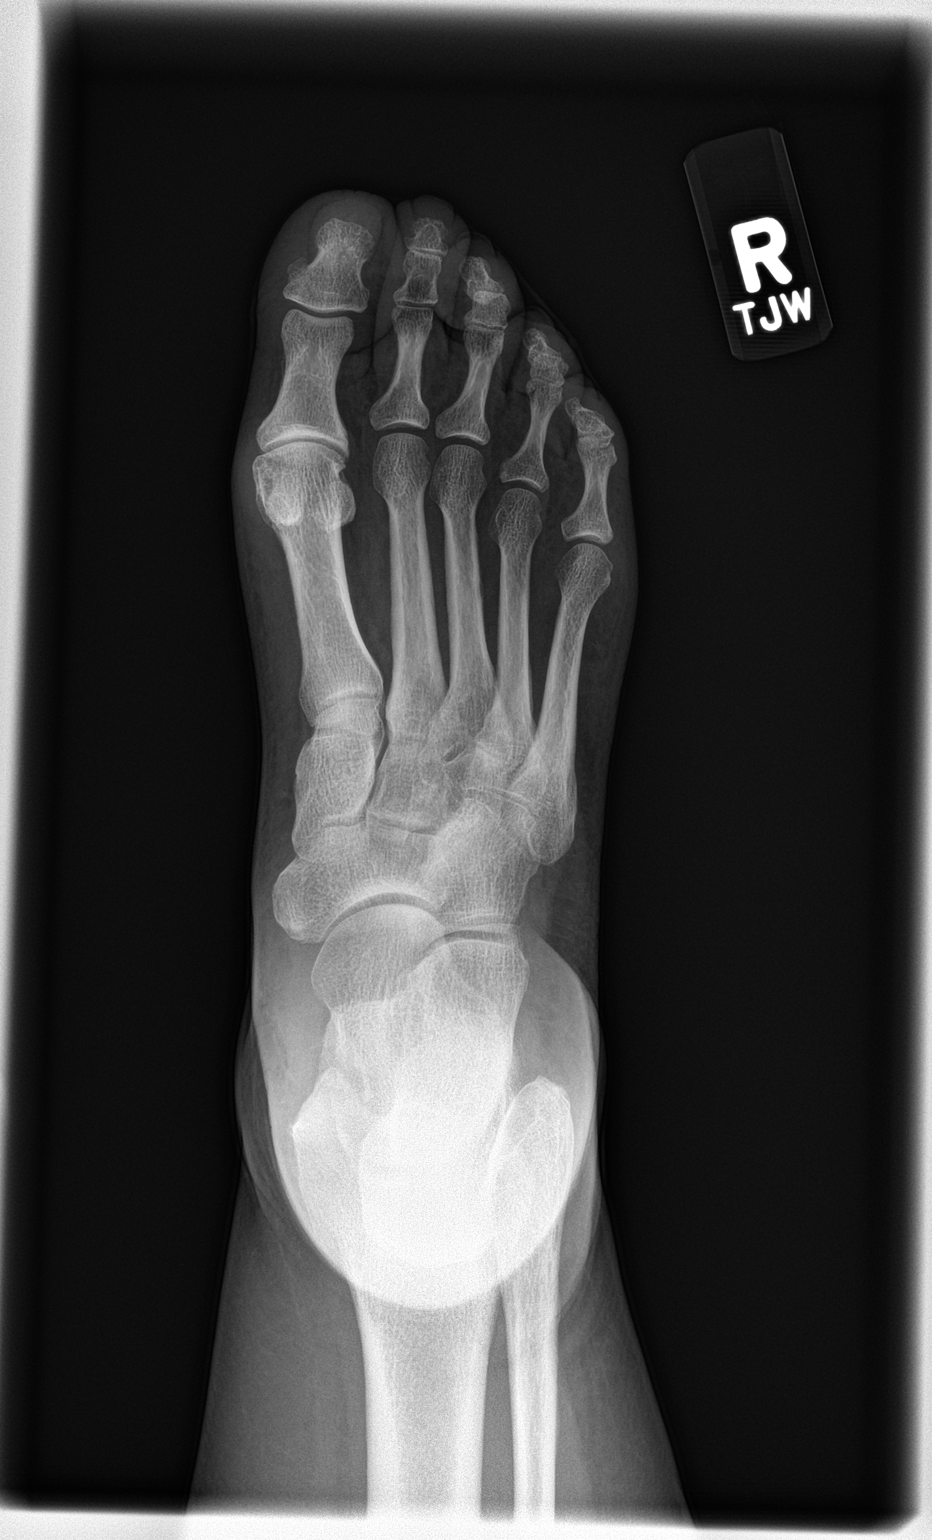

[foot obl]
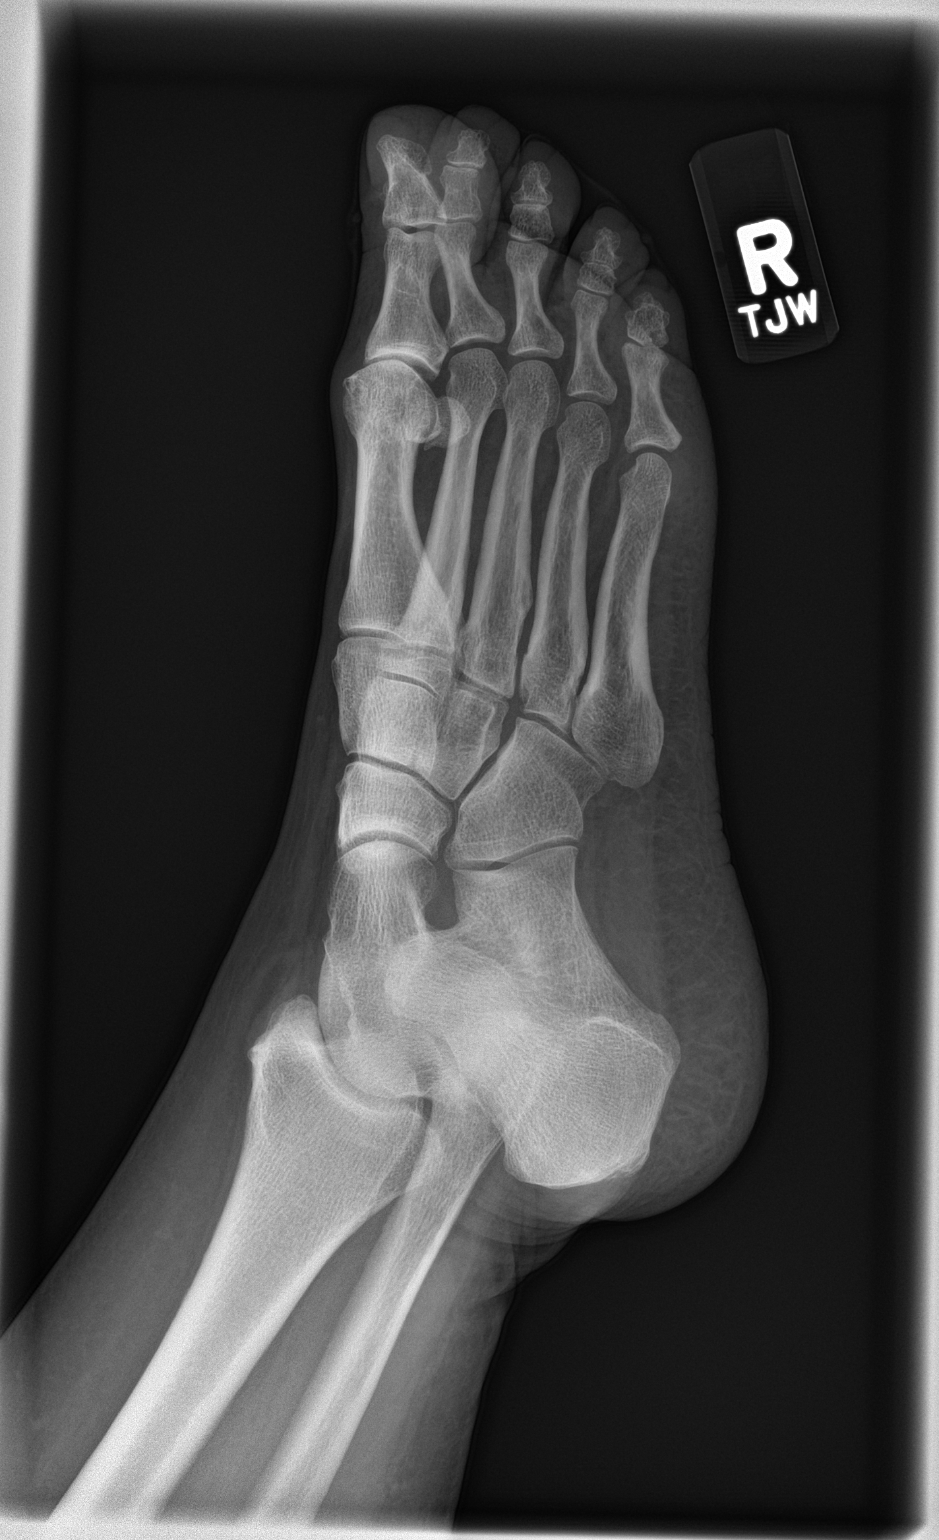

[foot lat]
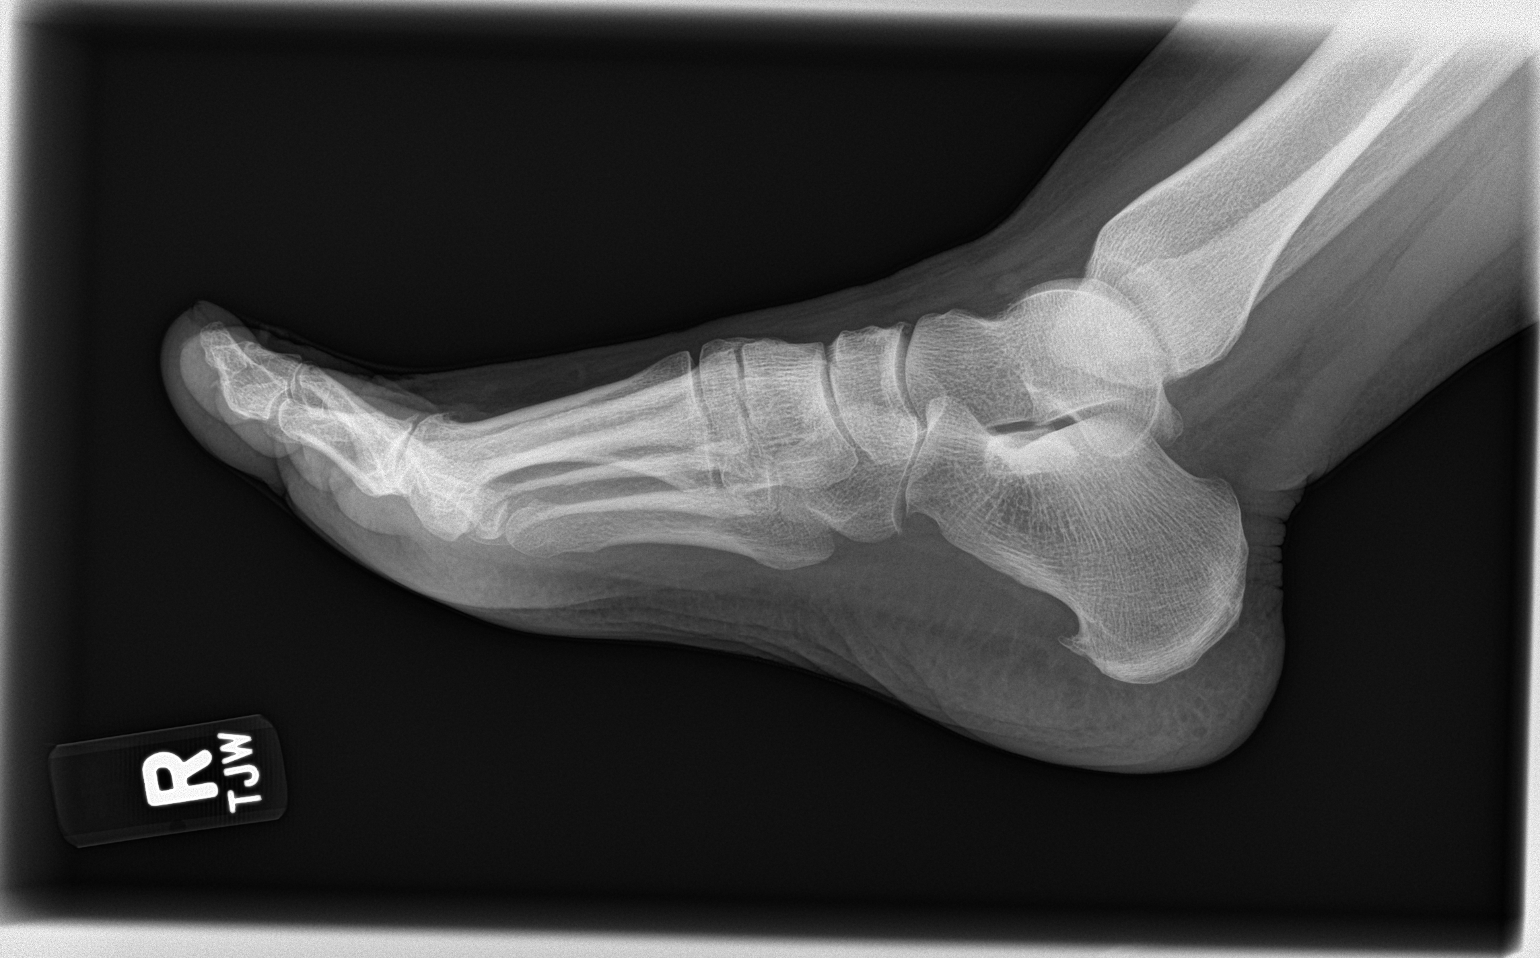

[3 of 3 positions shown; findings below may reference images not displayed]

FINDINGS: No fracture or malalignment. No osteopenia or bone erosion. Small
plantar calcaneal spur. Mild degenerative changes at the first MTP
joint
IMPRESSION: 1. Mild degenerative changes at the first MTP joint
2. Small plantar calcaneal spur

## 2017-08-04 ENCOUNTER — Other Ambulatory Visit: Payer: Self-pay | Admitting: Family Medicine

## 2017-08-04 NOTE — Telephone Encounter (Signed)
Copied from CRM [phone]#[id number]. Topic: Quick Communication - Rx Refill/Question >> Aug 04, 2017 10:52 AM Arlyss Gandyichardson, Trinda Harlacher N, NT wrote: Medication: atorvastatin (LIPITOR) 20 MG tablet   Has the patient contacted their pharmacy? Yes.   (Agent: If no, request that the patient contact the pharmacy for the refill.) (Agent: If yes, when and what did the pharmacy advise?)  Preferred Pharmacy (with phone number or street name): [id number] - [address] [phone] (Phone) [phone] (Fax)   Patient would like to see if she can have a partial refill until her appointment with Vernona RiegerKatherine Clark?   Agent: Please be advised that RX refills may take up to 3 business days. We ask that you follow-up with your pharmacy.

## 2017-08-05 NOTE — Telephone Encounter (Signed)
Next appt with Allayne GitelmanK. Clark on 08/28/17. Pt asking if she can be given a partial refill until scheduled appt.   LOV: 06/09/16 with Dr. Dayton MartesAron Last lipid panel 12/2015  [facility] [address] [facility] [place]

## 2017-08-07 ENCOUNTER — Other Ambulatory Visit: Payer: Self-pay | Admitting: Family Medicine

## 2017-08-07 MED ORDER — ATORVASTATIN CALCIUM 20 MG PO TABS: 20.0000 mg | ORAL_TABLET | Freq: Every day | ORAL | 0 refills | Status: DC

## 2017-08-08 NOTE — Telephone Encounter (Signed)
Faxed 30d to last till she sees new provider on 7.22.19/thx dmf

## 2017-08-28 ENCOUNTER — Encounter: Payer: Self-pay | Admitting: Primary Care

## 2017-08-28 ENCOUNTER — Other Ambulatory Visit: Payer: Self-pay | Admitting: Primary Care

## 2017-08-28 ENCOUNTER — Telehealth: Payer: Self-pay | Admitting: Primary Care

## 2017-08-28 ENCOUNTER — Ambulatory Visit (INDEPENDENT_AMBULATORY_CARE_PROVIDER_SITE_OTHER): Payer: Medicare Other | Admitting: Primary Care

## 2017-08-28 VITALS — BP 130/80 | HR 101 | Temp 98.2°F | Ht 63.5 in | Wt 217.5 lb

## 2017-08-28 DIAGNOSIS — M2550 Pain in unspecified joint: Secondary | ICD-10-CM | POA: Diagnosis not present

## 2017-08-28 DIAGNOSIS — K2100 Gastro-esophageal reflux disease with esophagitis, without bleeding: Secondary | ICD-10-CM

## 2017-08-28 DIAGNOSIS — E7800 Pure hypercholesterolemia, unspecified: Secondary | ICD-10-CM | POA: Diagnosis not present

## 2017-08-28 DIAGNOSIS — I10 Essential (primary) hypertension: Secondary | ICD-10-CM

## 2017-08-28 DIAGNOSIS — J309 Allergic rhinitis, unspecified: Secondary | ICD-10-CM

## 2017-08-28 DIAGNOSIS — R739 Hyperglycemia, unspecified: Secondary | ICD-10-CM

## 2017-08-28 DIAGNOSIS — E876 Hypokalemia: Secondary | ICD-10-CM

## 2017-08-28 LAB — COMPREHENSIVE METABOLIC PANEL
ALK PHOS: 68 U/L (ref 39–117)
ALT: 23 U/L (ref 0–35)
AST: 19 U/L (ref 0–37)
Albumin: 4.1 g/dL (ref 3.5–5.2)
BUN: 12 mg/dL (ref 6–23)
CHLORIDE: 102 meq/L (ref 96–112)
CO2: 30 mEq/L (ref 19–32)
Calcium: 9.3 mg/dL (ref 8.4–10.5)
Creatinine, Ser: 0.83 mg/dL (ref 0.40–1.20)
GFR: 73.03 mL/min (ref >60.00)
Glucose, Bld: 110 mg/dL — ABNORMAL HIGH (ref 70–99)
POTASSIUM: 3.3 meq/L — AB (ref 3.5–5.1)
Sodium: 141 mEq/L (ref 135–145)
TOTAL PROTEIN: 7.5 g/dL (ref 6.0–8.3)
Total Bilirubin: 0.5 mg/dL (ref 0.2–1.2)

## 2017-08-28 LAB — LIPID PANEL
CHOLESTEROL: 152 mg/dL (ref 0–200)
HDL: 50 mg/dL (ref >39.00)
LDL Cholesterol: 78 mg/dL (ref 0–99)
NONHDL: 101.8
TRIGLYCERIDES: 119 mg/dL (ref 0.0–149.0)
Total CHOL/HDL Ratio: 3
VLDL: 23.8 mg/dL (ref 0.0–40.0)

## 2017-08-28 MED ORDER — ATORVASTATIN CALCIUM 20 MG PO TABS: ORAL_TABLET | ORAL | 3 refills | Status: DC

## 2017-08-28 MED ORDER — AMLODIPINE BESYLATE 5 MG PO TABS
ORAL_TABLET | ORAL | 3 refills | Status: DC
Start: 2017-08-28 — End: 2018-11-16

## 2017-08-28 MED ORDER — PANTOPRAZOLE SODIUM 40 MG PO TBEC: 40.0000 mg | DELAYED_RELEASE_TABLET | Freq: Two times a day (BID) | ORAL | 1 refills | Status: DC

## 2017-08-28 MED ORDER — ETODOLAC 200 MG PO CAPS: 200.0000 mg | ORAL_CAPSULE | Freq: Three times a day (TID) | ORAL | 0 refills | Status: DC | PRN

## 2017-08-28 MED ORDER — MONTELUKAST SODIUM 10 MG PO TABS: ORAL_TABLET | ORAL | 3 refills | Status: DC

## 2017-08-28 MED ORDER — POTASSIUM CHLORIDE CRYS ER 20 MEQ PO TBCR: 20.0000 meq | EXTENDED_RELEASE_TABLET | Freq: Every day | ORAL | 0 refills | Status: DC

## 2017-08-28 NOTE — Assessment & Plan Note (Signed)
Doing well on Singulair, continue same.  

## 2017-08-28 NOTE — Assessment & Plan Note (Deleted)
Doing very well on Singulair, continue same.

## 2017-08-28 NOTE — Assessment & Plan Note (Signed)
Stable on pantoprazole 40 mg BID, continue same. Will have breakthrough symptoms occasionally with relief from Tums.

## 2017-08-28 NOTE — Telephone Encounter (Signed)
Please notify patient that her etodolac was not covered by her insurance.  Was she able to afford cash price or does she wish for an alternative?  If she needs an alternative then has she ever tried Meloxicam?

## 2017-08-28 NOTE — Telephone Encounter (Signed)
Electronically CHANGE request from etodolac (LODINE) 200 MG capsule  Pharmacy comment: Alternative Requested:NOT COVERED. NAPROXEN, IBUPROFEN, AND DICLOFENAC PREFERRED.

## 2017-08-28 NOTE — Assessment & Plan Note (Signed)
Stable in the office today, continue Amlodipine. BMP pending. 

## 2017-08-28 NOTE — Progress Notes (Signed)
Subjective:    Patient ID: Crystal Hardin, female    DOB: 11/24/1950, 67 y.o.   MRN: [id number]  HPI  [name] is a 67 year old female who presents today to transfer care from Dr. Dayton MartesAron.  1) Essential Hypertension: Currently managed on amlodipine 5 mg. She does check her BP at home once every 2 weeks which runns 120's/70-80's.   BP Readings from Last 3 Encounters:  08/28/17 130/80  06/09/16 110/74  06/07/16 (!) 150/95   2) Hyperlipidemia: Currently managed on atorvastatin 20 mg. Her last lipid panel was in 2017 with LDL of 84, Trigs of 121, HDL of 61.   3) GERD: Currently managed on pantoprazole 40 mg BID. Overall does well on her current regimen. Without her medication she'll experience esophageal burning and GI upset. She'll use Tums intermittently for breakthrough symptoms.   4) Allergic Rhinitis: Currently managed on Singulair 10 mg for which she takes daily. She feels well managed.   5) Osteoarthritis: Currently managed on etodolac 200 mg for which she's been taking for the past one year. She's noticed that her arthritis has progressed to her hands, toes, ankles. She will take 200 mg of etodolac once daily in the morning typically and Tylenol as needed for severe breakthrough pain.   6) Gout: Diagnosed in late 2017, does not take indomethacin. No gout flares since 2017.  Review of Systems  Eyes: Negative for visual disturbance.  Respiratory: Negative for shortness of breath.   Cardiovascular: Negative for chest pain.  Gastrointestinal:       GERD  Musculoskeletal: Positive for arthralgias.  Neurological: Negative for dizziness and headaches.       Past Medical History:  Diagnosis Date  . Hyperchloremia   . Hypertension      Social History   Socioeconomic History  . Marital status: Single    Spouse name: Not on file  . Number of children: 2  . Years of education: Not on file  . Highest education level: Not on file  Occupational History  . Occupation: Engineer, manufacturingffice  Manager  Social Needs  . Financial resource strain: Not on file  . Food insecurity:    Worry: Not on file    Inability: Not on file  . Transportation needs:    Medical: Not on file    Non-medical: Not on file  Tobacco Use  . Smoking status: Never Smoker  . Smokeless tobacco: Never Used  Substance and Sexual Activity  . Alcohol use: Yes    Comment: rarely  . Drug use: No  . Sexual activity: Not on file  Lifestyle  . Physical activity:    Days per week: Not on file    Minutes per session: Not on file  . Stress: Not on file  Relationships  . Social connections:    Talks on phone: Not on file    Gets together: Not on file    Attends religious service: Not on file    Active member of club or organization: Not on file    Attends meetings of clubs or organizations: Not on file    Relationship status: Not on file  . Intimate partner violence:    Fear of current or ex partner: Not on file    Emotionally abused: Not on file    Physically abused: Not on file    Forced sexual activity: Not on file  Other Topics Concern  . Not on file  Social History Narrative   Divorced   Print production plannerffice Manager  Two children, daughter and son    Past Surgical History:  Procedure Laterality Date  . KNEE SURGERY Right   . VAGINAL HYSTERECTOMY     left ovary intact, DUB    Family History  Problem Relation Age of Onset  . Hyperlipidemia Mother   . Hypertension Mother   . Cancer Mother        breast  . Arthritis Mother   . Heart disease Father   . Emphysema Father   . Hypertension Sister   . Hypertension Brother   . Dementia Maternal Aunt        Alzheimers  . Dementia Maternal Aunt        Alzheimers    No Known Allergies  No current outpatient medications on file prior to visit.   No current facility-administered medications on file prior to visit.     BP 130/80   Pulse (!) 101   Temp 98.2 F (36.8 C) (Oral)   Ht 5' 3.5" (1.613 m)   Wt 217 lb 8 oz (98.7 kg)   SpO2 97%   BMI  37.92 kg/m    Objective:   Physical Exam  Constitutional: She appears well-nourished.  Neck: Neck supple.  Cardiovascular: Normal rate and regular rhythm.  Respiratory: Effort normal and breath sounds normal.  Skin: Skin is warm and dry.  Psychiatric: She has a normal mood and affect.           Assessment & Plan:

## 2017-08-28 NOTE — Patient Instructions (Signed)
Stop by the lab prior to leaving today. I will notify you of your results once received.   I sent refills for your medications to your pharmacy.  You can increase the dose of your etodolac to 2 tablets every morning. Please update me if this does not help.  Schedule a Medicare Wellness Visit with our nurse and a follow up visit with me in 6 months.   It was a pleasure meeting you!

## 2017-08-28 NOTE — Assessment & Plan Note (Signed)
Overall does well on etodolac 200 mg, discussed to try 400 mg in the morning once daily. She will update. Also recommended regular exercise. Renal function pending.

## 2017-08-28 NOTE — Telephone Encounter (Signed)
Patient was seen today by Jae DireKate.  Patient's Pantoprazole was not refilled.  Patient's requesting a refill.  [facility].

## 2017-08-28 NOTE — Telephone Encounter (Signed)
Refill has been as requested.

## 2017-08-28 NOTE — Assessment & Plan Note (Signed)
Repeat lipid panel pending. Continue atorvastatin.  

## 2017-08-29 ENCOUNTER — Other Ambulatory Visit: Payer: Self-pay | Admitting: Primary Care

## 2017-08-29 DIAGNOSIS — M2550 Pain in unspecified joint: Secondary | ICD-10-CM

## 2017-08-29 NOTE — Telephone Encounter (Signed)
Spoken and notified patient of Crystal Hardin's comments. Patient stated that she would like the alternative, she is agreeable to try Meloxicam.

## 2017-08-30 ENCOUNTER — Other Ambulatory Visit: Payer: Self-pay | Admitting: Primary Care

## 2017-08-30 DIAGNOSIS — M2550 Pain in unspecified joint: Secondary | ICD-10-CM

## 2017-08-30 MED ORDER — MELOXICAM 7.5 MG PO TABS: ORAL_TABLET | ORAL | 0 refills | Status: DC

## 2017-09-11 ENCOUNTER — Other Ambulatory Visit (INDEPENDENT_AMBULATORY_CARE_PROVIDER_SITE_OTHER): Payer: Medicare Other

## 2017-09-11 DIAGNOSIS — E876 Hypokalemia: Secondary | ICD-10-CM | POA: Diagnosis not present

## 2017-09-11 DIAGNOSIS — R739 Hyperglycemia, unspecified: Secondary | ICD-10-CM

## 2017-09-11 LAB — HEMOGLOBIN A1C: HEMOGLOBIN A1C: 5.7 % (ref 4.6–6.5)

## 2017-09-11 LAB — POTASSIUM: POTASSIUM: 3.9 meq/L (ref 3.5–5.1)

## 2017-11-28 ENCOUNTER — Ambulatory Visit (INDEPENDENT_AMBULATORY_CARE_PROVIDER_SITE_OTHER): Payer: Medicare Other | Admitting: Internal Medicine

## 2017-11-28 ENCOUNTER — Encounter: Payer: Self-pay | Admitting: Internal Medicine

## 2017-11-28 VITALS — BP 128/82 | HR 100 | Temp 98.6°F | Wt 214.0 lb

## 2017-11-28 DIAGNOSIS — J069 Acute upper respiratory infection, unspecified: Secondary | ICD-10-CM

## 2017-11-28 LAB — POC INFLUENZA A&B (BINAX/QUICKVUE)
INFLUENZA A, POC: NEGATIVE
Influenza B, POC: NEGATIVE

## 2017-11-28 NOTE — Addendum Note (Signed)
Addended by: Roena Malady on: 11/28/2017 04:55 PM   Modules accepted: Orders

## 2017-11-28 NOTE — Progress Notes (Addendum)
HPI  Pt presents to the clinic today with c/o facial pressure, runny nose, ear fullness, and sore throat. She reports this originally started 4-5 days ago, seemed to get better and then symptoms returned last night. She is blowing clear mucous out of her nose. She denies ringing in the ears, ear pain or decreased hearing. She denies difficulty swallowing. She denies nasal congestion, cough or chest congestion. She denies fever or chills, but has had body aches. She denies Singular, Tylenol Cold and Sinus with minimal relief. She has a history of allergies. She has not had sick contacts.  Review of Systems      Past Medical History:  Diagnosis Date  . Allergic rhinitis   . GERD (gastroesophageal reflux disease)   . Gout   . Hyperlipidemia   . Hypertension   . Osteoarthritis     Family History  Problem Relation Age of Onset  . Hyperlipidemia Mother   . Hypertension Mother   . Cancer Mother        breast  . Arthritis Mother   . Heart disease Father   . Emphysema Father   . Hypertension Sister   . Hypertension Brother   . Dementia Maternal Aunt        Alzheimers  . Dementia Maternal Aunt        Alzheimers    Social History   Socioeconomic History  . Marital status: Single    Spouse name: Not on file  . Number of children: 2  . Years of education: Not on file  . Highest education level: Not on file  Occupational History  . Occupation: Print production planner  Social Needs  . Financial resource strain: Not on file  . Food insecurity:    Worry: Not on file    Inability: Not on file  . Transportation needs:    Medical: Not on file    Non-medical: Not on file  Tobacco Use  . Smoking status: Never Smoker  . Smokeless tobacco: Never Used  Substance and Sexual Activity  . Alcohol use: Yes    Comment: rarely  . Drug use: No  . Sexual activity: Not on file  Lifestyle  . Physical activity:    Days per week: Not on file    Minutes per session: Not on file  . Stress: Not on  file  Relationships  . Social connections:    Talks on phone: Not on file    Gets together: Not on file    Attends religious service: Not on file    Active member of club or organization: Not on file    Attends meetings of clubs or organizations: Not on file    Relationship status: Not on file  . Intimate partner violence:    Fear of current or ex partner: Not on file    Emotionally abused: Not on file    Physically abused: Not on file    Forced sexual activity: Not on file  Other Topics Concern  . Not on file  Social History Narrative   Divorced   Print production planner   Two children, daughter and son    No Known Allergies   Constitutional:. Denies headache, fatigue, fever or abrupt weight changes.  HEENT:  Positive facial pressure, ear fullness, runny nose, sore throat. Denies eye redness, eye pain, pressure behind the eyes, facial pain, nasal congestion, ear pain, ringing in the ears, wax buildup, or bloody nose. Respiratory: Denies cough, difficulty breathing or shortness of breath.  Cardiovascular: Denies chest  pain, chest tightness, palpitations or swelling in the hands or feet.   No other specific complaints in a complete review of systems (except as listed in HPI above).  Objective:   BP 128/82   Pulse 100   Temp 98.6 F (37 C) (Oral)   Wt 214 lb (97.1 kg)   SpO2 97%   BMI 37.31 kg/m  Wt Readings from Last 3 Encounters:  11/28/17 214 lb (97.1 kg)  08/28/17 217 lb 8 oz (98.7 kg)  06/09/16 207 lb (93.9 kg)     General: Appears her stated age, in NAD. HEENT: Head: normal shape and size, maxillary sinus tenderness  noted;  Ears: Tm's gray and intact, normal light reflex; Nose: mucosa pink and moist, septum midline; Throat/Mouth: + PND. Teeth present, mucosa pink and moist, no exudate noted, no lesions or ulcerations noted.  Neck: No cervical lymphadenopathy.  Cardiovascular: Normal rate and rhythm. S1,S2 noted.  No murmur, rubs or gallops noted.  Pulmonary/Chest:  Normal effort and positive vesicular breath sounds. No respiratory distress. No wheezes, rales or ronchi noted.       Assessment & Plan:   Viral Upper Respiratory Infection:  Get some rest and drink plenty of water Do salt water gargles for the sore throat Rapid Flu: negative Ibuprofen 600 mg every 8 hours as needed for sore throat/body aches (hold Meloxicam) Start Zyrtec and Flonase OTC  RTC as needed or if symptoms persist.   Nicki Reaper, NP

## 2017-11-28 NOTE — Patient Instructions (Signed)
Upper Respiratory Infection, Adult Most upper respiratory infections (URIs) are caused by a virus. A URI affects the nose, throat, and upper air passages. The most common type of URI is often called "the common cold." Follow these instructions at home:  Take medicines only as told by your doctor.  Gargle warm saltwater or take cough drops to comfort your throat as told by your doctor.  Use a warm mist humidifier or inhale steam from a shower to increase air moisture. This may make it easier to breathe.  Drink enough fluid to keep your pee (urine) clear or pale yellow.  Eat soups and other clear broths.  Have a healthy diet.  Rest as needed.  Go back to work when your fever is gone or your doctor says it is okay. ? You may need to stay home longer to avoid giving your URI to others. ? You can also wear a face mask and wash your hands often to prevent spread of the virus.  Use your inhaler more if you have asthma.  Do not use any tobacco products, including cigarettes, chewing tobacco, or electronic cigarettes. If you need help quitting, ask your doctor. Contact a doctor if:  You are getting worse, not better.  Your symptoms are not helped by medicine.  You have chills.  You are getting more short of breath.  You have brown or red mucus.  You have yellow or brown discharge from your nose.  You have pain in your face, especially when you bend forward.  You have a fever.  You have puffy (swollen) neck glands.  You have pain while swallowing.  You have white areas in the back of your throat. Get help right away if:  You have very bad or constant: ? Headache. ? Ear pain. ? Pain in your forehead, behind your eyes, and over your cheekbones (sinus pain). ? Chest pain.  You have long-lasting (chronic) lung disease and any of the following: ? Wheezing. ? Long-lasting cough. ? Coughing up blood. ? A change in your usual mucus.  You have a stiff neck.  You have  changes in your: ? Vision. ? Hearing. ? Thinking. ? Mood. This information is not intended to replace advice given to you by your health care provider. Make sure you discuss any questions you have with your health care provider. Document Released: 07/13/2007 Document Revised: 09/27/2015 Document Reviewed: 05/01/2013 Elsevier Interactive Patient Education  2018 [employer]vier Inc[employer]  

## 2017-11-29 ENCOUNTER — Other Ambulatory Visit: Payer: Self-pay | Admitting: Primary Care

## 2017-11-29 DIAGNOSIS — M2550 Pain in unspecified joint: Secondary | ICD-10-CM

## 2017-11-30 NOTE — Telephone Encounter (Signed)
Noted, refill sent to pharmacy. 

## 2017-11-30 NOTE — Telephone Encounter (Signed)
Last prescribed on 08/30/2017 Last office visit on 08/28/2017

## 2018-02-24 ENCOUNTER — Other Ambulatory Visit: Payer: Self-pay | Admitting: Primary Care

## 2018-02-24 DIAGNOSIS — M2550 Pain in unspecified joint: Secondary | ICD-10-CM

## 2018-02-28 ENCOUNTER — Ambulatory Visit (INDEPENDENT_AMBULATORY_CARE_PROVIDER_SITE_OTHER): Payer: Medicare HMO

## 2018-02-28 VITALS — BP 122/70 | HR 92 | Temp 98.2°F | Ht 63.5 in | Wt 207.8 lb

## 2018-02-28 DIAGNOSIS — I10 Essential (primary) hypertension: Secondary | ICD-10-CM

## 2018-02-28 DIAGNOSIS — E7800 Pure hypercholesterolemia, unspecified: Secondary | ICD-10-CM | POA: Diagnosis not present

## 2018-02-28 DIAGNOSIS — Z1159 Encounter for screening for other viral diseases: Secondary | ICD-10-CM | POA: Diagnosis not present

## 2018-02-28 DIAGNOSIS — Z0000 Encounter for general adult medical examination without abnormal findings: Secondary | ICD-10-CM

## 2018-02-28 LAB — LIPID PANEL
Cholesterol: 161 mg/dL (ref 0–200)
HDL: 55.5 mg/dL (ref >39.00)
LDL Cholesterol: 85 mg/dL (ref 0–99)
NonHDL: 105.78
Total CHOL/HDL Ratio: 3
Triglycerides: 106 mg/dL (ref 0.0–149.0)
VLDL: 21.2 mg/dL (ref 0.0–40.0)

## 2018-02-28 LAB — CBC WITH DIFFERENTIAL/PLATELET
Basophils Absolute: 0.1 10*3/uL (ref 0.0–0.1)
Basophils Relative: 1 % (ref 0.0–3.0)
Eosinophils Absolute: 0.1 10*3/uL (ref 0.0–0.7)
Eosinophils Relative: 1.2 % (ref 0.0–5.0)
HCT: 38.2 % (ref 36.0–46.0)
Hemoglobin: 13.2 g/dL (ref 12.0–15.0)
Lymphocytes Relative: 33.6 % (ref 12.0–46.0)
Lymphs Abs: 1.8 10*3/uL (ref 0.7–4.0)
MCHC: 34.6 g/dL (ref 30.0–36.0)
MCV: 85.7 fl (ref 78.0–100.0)
Monocytes Absolute: 0.3 10*3/uL (ref 0.1–1.0)
Monocytes Relative: 6.4 % (ref 3.0–12.0)
NEUTROS ABS: 3.1 10*3/uL (ref 1.4–7.7)
Neutrophils Relative %: 57.8 % (ref 43.0–77.0)
PLATELETS: 313 10*3/uL (ref 150.0–400.0)
RBC: 4.46 Mil/uL (ref 3.87–5.11)
RDW: 12.4 % (ref 11.5–15.5)
WBC: 5.3 10*3/uL (ref 4.0–10.5)

## 2018-02-28 LAB — COMPREHENSIVE METABOLIC PANEL
ALBUMIN: 4.1 g/dL (ref 3.5–5.2)
ALK PHOS: 71 U/L (ref 39–117)
ALT: 16 U/L (ref 0–35)
AST: 17 U/L (ref 0–37)
BUN: 12 mg/dL (ref 6–23)
CHLORIDE: 102 meq/L (ref 96–112)
CO2: 31 mEq/L (ref 19–32)
CREATININE: 0.74 mg/dL (ref 0.40–1.20)
Calcium: 9.8 mg/dL (ref 8.4–10.5)
GFR: 78.33 mL/min (ref >60.00)
Glucose, Bld: 102 mg/dL — ABNORMAL HIGH (ref 70–99)
Potassium: 3.7 mEq/L (ref 3.5–5.1)
SODIUM: 139 meq/L (ref 135–145)
TOTAL PROTEIN: 7.4 g/dL (ref 6.0–8.3)
Total Bilirubin: 0.6 mg/dL (ref 0.2–1.2)

## 2018-02-28 NOTE — Progress Notes (Signed)
PCP notes:   Health maintenance:  Flu vaccine - PCP follow-up requested PNA vaccine - PCP follow-up requested Mammogram - PCP follow-up requested Bone density - PCP follow-up requested Colonoscopy - PCP follow-up requested Hep C screening - completed  Abnormal screenings:   Hearing - failed  Hearing Screening   125Hz  250Hz  500Hz  1000Hz  2000Hz  3000Hz  4000Hz  6000Hz  8000Hz   Right ear:   0 40 40  40    Left ear:   0 40 40  40      Fall risk - hx of single fall Fall Risk  02/28/2018  Falls in the past year? 1  Comment pt tripped over a stool and went to ER  Number falls in past yr: 0  Injury with Fall? 1   Patient concerns:   None  Nurse concerns:  None  Next PCP appt:   03/14/18 @ 1400

## 2018-02-28 NOTE — Progress Notes (Signed)
Subjective:   Crystal Hardin is a 68 y.o. female who presents for an Initial Medicare Annual Wellness Visit.  Review of Systems    N/A  Cardiac Risk Factors include: advanced age (>52men, >67 women);dyslipidemia;hypertension;obesity (BMI >30kg/m2)     Objective:    Today's Vitals   02/28/18 1036 02/28/18 1458  BP: 122/70   Pulse: 92   Temp: 98.2 F (36.8 C)   TempSrc: Oral   SpO2: 98%   Weight: 207 lb 12 oz (94.2 kg)   Height: 5' 3.5" (1.613 m)   PainSc: 5  5   PainLoc: Generalized    Body mass index is 36.22 kg/m.  Advanced Directives 02/28/2018 06/06/2016 03/21/2015  Does Patient Have a Medical Advance Directive? No No No  Would patient like information on creating a medical advance directive? No - Patient declined No - Patient declined Yes - Educational materials given    Current Medications (verified) Outpatient Encounter Medications as of 02/28/2018  Medication Sig  . amLODipine (NORVASC) 5 MG tablet Take 1 tablet by mouth once daily for high blood pressure.  Marland Kitchen atorvastatin (LIPITOR) 20 MG tablet Take 1 tablet by mouth once daily for cholesterol.  . meloxicam (MOBIC) 7.5 MG tablet TAKE 1 TABLET BY MOUTH EVERY DAY AS NEEDED FOR PAIN  . montelukast (SINGULAIR) 10 MG tablet Take 1 tablet by mouth once daily for allergies.  . pantoprazole (PROTONIX) 40 MG tablet Take 1 tablet (40 mg total) by mouth 2 (two) times daily.  . [DISCONTINUED] potassium chloride SA (K-DUR,KLOR-CON) 20 MEQ tablet Take 1 tablet (20 mEq total) by mouth daily for 5 days.   No facility-administered encounter medications on file as of 02/28/2018.     Allergies (verified) Patient has no known allergies.   History: Past Medical History:  Diagnosis Date  . Allergic rhinitis   . GERD (gastroesophageal reflux disease)   . Gout   . Hyperlipidemia   . Hypertension   . Osteoarthritis    Past Surgical History:  Procedure Laterality Date  . KNEE SURGERY Right   . VAGINAL HYSTERECTOMY     left ovary intact, DUB   Family History  Problem Relation Age of Onset  . Hyperlipidemia Mother   . Hypertension Mother   . Cancer Mother        breast  . Arthritis Mother   . Heart disease Father   . Emphysema Father   . Hypertension Sister   . Hypertension Brother   . Dementia Maternal Aunt        Alzheimers  . Dementia Maternal Aunt        Alzheimers   Social History   Socioeconomic History  . Marital status: Single    Spouse name: Not on file  . Number of children: 2  . Years of education: Not on file  . Highest education level: Not on file  Occupational History  . Occupation: Print production planner  Social Needs  . Financial resource strain: Not on file  . Food insecurity:    Worry: Not on file    Inability: Not on file  . Transportation needs:    Medical: Not on file    Non-medical: Not on file  Tobacco Use  . Smoking status: Never Smoker  . Smokeless tobacco: Never Used  Substance and Sexual Activity  . Alcohol use: Yes    Comment: rarely  . Drug use: No  . Sexual activity: Not on file  Lifestyle  . Physical activity:    Days per  week: Not on file    Minutes per session: Not on file  . Stress: Not on file  Relationships  . Social connections:    Talks on phone: Not on file    Gets together: Not on file    Attends religious service: Not on file    Active member of club or organization: Not on file    Attends meetings of clubs or organizations: Not on file    Relationship status: Not on file  Other Topics Concern  . Not on file  Social History Narrative   Divorced   Print production planner   Two children, daughter and son    Tobacco Counseling Counseling given: No   Clinical Intake:  Pre-visit preparation completed: Yes  Pain Score: 5      Nutritional Status: BMI > 30  Obese Nutritional Risks: None Diabetes: No  How often do you need to have someone help you when you read instructions, pamphlets, or other written materials from your doctor or  pharmacy?: 1 - Never What is the last grade level you completed in school?: 12th grade  Interpreter Needed?: No  Comments: pt lives alone Information entered by :: LPinson, LPN   Activities of Daily Living In your present state of health, do you have any difficulty performing the following activities: 02/28/2018  Hearing? N  Vision? N  Difficulty concentrating or making decisions? N  Walking or climbing stairs? N  Dressing or bathing? N  Doing errands, shopping? N  Preparing Food and eating ? N  Using the Toilet? N  In the past six months, have you accidently leaked urine? N  Do you have problems with loss of bowel control? N  Managing your Medications? N  Managing your Finances? N  Housekeeping or managing your Housekeeping? N  Some recent data might be hidden     Immunizations and Health Maintenance Immunization History  Administered Date(s) Administered  . Influenza, Seasonal, Injecte, Preservative Fre 12/08/2015  . Influenza,inj,Quad PF,6+ Mos 10/21/2013, 11/04/2014  . Td 01/18/2006  . Tdap 05/17/2017   There are no preventive care reminders to display for this patient.  Patient Care Team: Doreene Nest, NP as PCP - General (Internal Medicine)  Indicate any recent Medical Services you may have received from other than [facility] providers in the past year (date may be approximate).     Assessment:   This is a routine wellness examination for Caylen.  Hearing/Vision screen  Hearing Screening   125Hz  250Hz  500Hz  1000Hz  2000Hz  3000Hz  4000Hz  6000Hz  8000Hz   Right ear:   0 40 40  40    Left ear:   0 40 40  40    Vision Screening Comments: Vision exam in 2019 with Dr. Lew Dawes  Dietary issues and exercise activities discussed: Current Exercise Habits: The patient does not participate in regular exercise at present, Exercise limited by: None identified  Goals    . Patient Stated     Starting 02/28/18, I will continue to take medications as prescribed.        Depression Screen PHQ 2/9 Scores 02/28/2018  PHQ - 2 Score 0  PHQ- 9 Score 0    Fall Risk Fall Risk  02/28/2018  Falls in the past year? 1  Comment pt tripped over a stool and went to ER  Number falls in past yr: 0  Injury with Fall? 1   Cognitive Function: MMSE - Mini Mental State Exam 02/28/2018  Orientation to time 5  Orientation to Place 5  Registration 3  Attention/ Calculation 0  Recall 3  Language- name 2 objects 0  Language- repeat 1  Language- follow 3 step command 3  Language- read & follow direction 0  Write a sentence 0  Copy design 0  Total score 20     PLEASE NOTE: A Mini-Cog screen was completed. Maximum score is 20. A value of 0 denotes this part of Folstein MMSE was not completed or the patient failed this part of the Mini-Cog screening.   Mini-Cog Screening Orientation to Time - Max 5 pts Orientation to Place - Max 5 pts Registration - Max 3 pts Recall - Max 3 pts Language Repeat - Max 1 pts Language Follow 3 Step Command - Max 3 pts     Screening Tests Health Maintenance  Topic Date Due  . INFLUENZA VACCINE  05/08/2018 (Originally 09/07/2017)  . MAMMOGRAM  02/07/2020 (Originally 06/22/2012)  . DEXA SCAN  02/07/2020 (Originally 05/09/2016)  . COLONOSCOPY  02/07/2020 (Originally 05/09/2001)  . PNA vac Low Risk Adult (1 of 2 - PCV13) 02/07/2020 (Originally 05/09/2016)  . TETANUS/TDAP  05/18/2027  . Hepatitis C Screening  Completed     Plan:     I have personally reviewed, addressed, and noted the following in the patient's chart:  A. Medical and social history B. Use of alcohol, tobacco or illicit drugs  C. Current medications and supplements D. Functional ability and status E.  Nutritional status F.  Physical activity G. Advance directives H. List of other physicians I.  Hospitalizations, surgeries, and ER visits in previous 12 months J.  Vitals K. Screenings to include hearing, vision, cognitive, depression L. Referrals and appointments -  none  In addition, I have reviewed and discussed with patient certain preventive protocols, quality metrics, and best practice recommendations. A written personalized care plan for preventive services as well as general preventive health recommendations were provided to patient.  See attached scanned questionnaire for additional information.   Signed,   Randa EvensLesia Elyon Zoll, MHA, BS, LPN Health Coach

## 2018-02-28 NOTE — Patient Instructions (Signed)
Crystal Hardin , Thank you for taking time to come for your Medicare Wellness Visit. I appreciate your ongoing commitment to your health goals. Please review the following plan we discussed and let me know if I can assist you in the future.   These are the goals we discussed: Goals    . Patient Stated     Starting 02/28/18, I will continue to take medications as prescribed.        This is a list of the screening recommended for you and due dates:  Health Maintenance  Topic Date Due  . Flu Shot  05/08/2018*  . Mammogram  02/07/2020*  . DEXA scan (bone density measurement)  02/07/2020*  . Colon Cancer Screening  02/07/2020*  . Pneumonia vaccines (1 of 2 - PCV13) 02/07/2020*  . Tetanus Vaccine  05/18/2027  .  Hepatitis C: One time screening is recommended by Center for Disease Control  (CDC) for  adults born from [birth year] through 1964.   Completed  *Topic was postponed. The date shown is not the original due date.   Preventive Care for Adults  A healthy lifestyle and preventive care can promote health and wellness. Preventive health guidelines for adults include the following key practices.  . A routine yearly physical is a good way to check with your health care provider about your health and preventive screening. It is a chance to share any concerns and updates on your health and to receive a thorough exam.  . Visit your dentist for a routine exam and preventive care every 6 months. Brush your teeth twice a day and floss once a day. Good oral hygiene prevents tooth decay and gum disease.  . The frequency of eye exams is based on your age, health, family medical history, use  of contact lenses, and other factors. Follow your health care provider's recommendations for frequency of eye exams.  . Eat a healthy diet. Foods like vegetables, fruits, whole grains, low-fat dairy products, and lean protein foods contain the nutrients you need without too many calories. Decrease your intake of foods  high in solid fats, added sugars, and salt. Eat the right amount of calories for you. Get information about a proper diet from your health care provider, if necessary.  . Regular physical exercise is one of the most important things you can do for your health. Most adults should get at least 150 minutes of moderate-intensity exercise (any activity that increases your heart rate and causes you to sweat) each week. In addition, most adults need muscle-strengthening exercises on 2 or more days a week.  Silver Sneakers may be a benefit available to you. To determine eligibility, you may visit the website: www.silversneakers.com or contact program at [phone] Mon-Fri between 8AM-8PM.   . Maintain a healthy weight. The body mass index (BMI) is a screening tool to identify possible weight problems. It provides an estimate of body fat based on height and weight. Your health care provider can find your BMI and can help you achieve or maintain a healthy weight.   For adults 20 years and older: ? A BMI below 18.5 is considered underweight. ? A BMI of 18.5 to 24.9 is normal. ? A BMI of 25 to 29.9 is considered overweight. ? A BMI of 30 and above is considered obese.   . Maintain normal blood lipids and cholesterol levels by exercising and minimizing your intake of saturated fat. Eat a balanced diet with plenty of fruit and vegetables. Blood tests for lipids and  cholesterol should begin at age 61 and be repeated every 5 years. If your lipid or cholesterol levels are high, you are over 50, or you are at high risk for heart disease, you may need your cholesterol levels checked more frequently. Ongoing high lipid and cholesterol levels should be treated with medicines if diet and exercise are not working.  . If you smoke, find out from your health care provider how to quit. If you do not use tobacco, please do not start.  . If you choose to drink alcohol, please do not consume more than 2 drinks per day.  One drink is considered to be 12 ounces (355 mL) of beer, 5 ounces (148 mL) of wine, or 1.5 ounces (44 mL) of liquor.  . If you are 39-22 years old, ask your health care provider if you should take aspirin to prevent strokes.  . Use sunscreen. Apply sunscreen liberally and repeatedly throughout the day. You should seek shade when your shadow is shorter than you. Protect yourself by wearing long sleeves, pants, a wide-brimmed hat, and sunglasses year round, whenever you are outdoors.  . Once a month, do a whole body skin exam, using a mirror to look at the skin on your back. Tell your health care provider of new moles, moles that have irregular borders, moles that are larger than a pencil eraser, or moles that have changed in shape or color.

## 2018-02-28 NOTE — Progress Notes (Signed)
I reviewed health advisor's note, was available for consultation, and agree with documentation and plan.  

## 2018-03-01 LAB — HEPATITIS C ANTIBODY
HEP C AB: NONREACTIVE
SIGNAL TO CUT-OFF: 0.03 (ref <1.00)

## 2018-03-06 ENCOUNTER — Encounter: Payer: Medicare Other | Admitting: Primary Care

## 2018-03-14 ENCOUNTER — Encounter: Payer: Self-pay | Admitting: Primary Care

## 2018-03-14 ENCOUNTER — Ambulatory Visit (INDEPENDENT_AMBULATORY_CARE_PROVIDER_SITE_OTHER): Payer: Medicare HMO | Admitting: Primary Care

## 2018-03-14 VITALS — BP 116/74 | HR 80 | Temp 97.8°F | Ht 63.5 in | Wt 208.2 lb

## 2018-03-14 DIAGNOSIS — K2100 Gastro-esophageal reflux disease with esophagitis, without bleeding: Secondary | ICD-10-CM

## 2018-03-14 DIAGNOSIS — Z23 Encounter for immunization: Secondary | ICD-10-CM

## 2018-03-14 DIAGNOSIS — E7800 Pure hypercholesterolemia, unspecified: Secondary | ICD-10-CM | POA: Diagnosis not present

## 2018-03-14 DIAGNOSIS — R7303 Prediabetes: Secondary | ICD-10-CM | POA: Diagnosis not present

## 2018-03-14 DIAGNOSIS — I10 Essential (primary) hypertension: Secondary | ICD-10-CM | POA: Diagnosis not present

## 2018-03-14 DIAGNOSIS — Z0000 Encounter for general adult medical examination without abnormal findings: Secondary | ICD-10-CM | POA: Insufficient documentation

## 2018-03-14 DIAGNOSIS — E2839 Other primary ovarian failure: Secondary | ICD-10-CM

## 2018-03-14 DIAGNOSIS — Z1239 Encounter for other screening for malignant neoplasm of breast: Secondary | ICD-10-CM | POA: Diagnosis not present

## 2018-03-14 DIAGNOSIS — Z0001 Encounter for general adult medical examination with abnormal findings: Secondary | ICD-10-CM | POA: Insufficient documentation

## 2018-03-14 DIAGNOSIS — J309 Allergic rhinitis, unspecified: Secondary | ICD-10-CM

## 2018-03-14 DIAGNOSIS — M2550 Pain in unspecified joint: Secondary | ICD-10-CM

## 2018-03-14 LAB — POCT GLYCOSYLATED HEMOGLOBIN (HGB A1C): Hemoglobin A1C: 5.5 % (ref 4.0–5.6)

## 2018-03-14 MED ORDER — MONTELUKAST SODIUM 10 MG PO TABS: ORAL_TABLET | ORAL | 3 refills | Status: DC

## 2018-03-14 MED ORDER — ATORVASTATIN CALCIUM 20 MG PO TABS: ORAL_TABLET | ORAL | 3 refills | Status: DC

## 2018-03-14 MED ORDER — PANTOPRAZOLE SODIUM 40 MG PO TBEC: 40.0000 mg | DELAYED_RELEASE_TABLET | Freq: Every day | ORAL | 1 refills | Status: DC

## 2018-03-14 MED ORDER — ZOSTER VAC RECOMB ADJUVANTED 50 MCG/0.5ML IM SUSR: 0.5000 mL | Freq: Once | INTRAMUSCULAR | 1 refills | Status: AC

## 2018-03-14 NOTE — Patient Instructions (Signed)
Call the [facility] to schedule your mammogram and bone density tests.  Complete the cologuard kit as directed.  Try to hold the Meloxicam for now, use Tylenol 500 mg-1000 mg three times daily as needed. Do not exceed 3000 mg in 24 hours.  Start calcium 1200 mg with vitamin D 800 units daily.   Start exercising. You should be getting 150 minutes of moderate intensity exercise weekly.  It's important to improve your diet by reducing consumption of fast food, fried food, processed snack foods, sugary drinks. Increase consumption of fresh vegetables and fruits, whole grains, water.  Ensure you are drinking 64 ounces of water daily.  Stop by the lab prior to leaving today. I will notify you of your results once received.   We will see you next year for your annual exam or sooner if needed.  It was a pleasure to see you today!   Preventive Care 67 Years and Older, Female Preventive care refers to lifestyle choices and visits with your health care provider that can promote health and wellness. What does preventive care include?  A yearly physical exam. This is also called an annual well check.  Dental exams once or twice a year.  Routine eye exams. Ask your health care provider how often you should have your eyes checked.  Personal lifestyle choices, including: ? Daily care of your teeth and gums. ? Regular physical activity. ? Eating a healthy diet. ? Avoiding tobacco and drug use. ? Limiting alcohol use. ? Practicing safe sex. ? Taking low-dose aspirin every day. ? Taking vitamin and mineral supplements as recommended by your health care provider. What happens during an annual well check? The services and screenings done by your health care provider during your annual well check will depend on your age, overall health, lifestyle risk factors, and family history of disease. Counseling Your health care provider may ask you questions about your:  Alcohol  use.  Tobacco use.  Drug use.  Emotional well-being.  Home and relationship well-being.  Sexual activity.  Eating habits.  History of falls.  Memory and ability to understand (cognition).  Work and work Statistician.  Reproductive health.  Screening You may have the following tests or measurements:  Height, weight, and BMI.  Blood pressure.  Lipid and cholesterol levels. These may be checked every 5 years, or more frequently if you are over 55 years old.  Skin check.  Lung cancer screening. You may have this screening every year starting at age 72 if you have a 30-pack-year history of smoking and currently smoke or have quit within the past 15 years.  Colorectal cancer screening. All adults should have this screening starting at age 22 and continuing until age 44. You will have tests every 1-10 years, depending on your results and the type of screening test. People at increased risk should start screening at an earlier age. Screening tests may include: ? Guaiac-based fecal occult blood testing. ? Fecal immunochemical test (FIT). ? Stool DNA test. ? Virtual colonoscopy. ? Sigmoidoscopy. During this test, a flexible tube with a tiny camera (sigmoidoscope) is used to examine your rectum and lower colon. The sigmoidoscope is inserted through your anus into your rectum and lower colon. ? Colonoscopy. During this test, a long, thin, flexible tube with a tiny camera (colonoscope) is used to examine your entire colon and rectum.  Hepatitis C blood test.  Hepatitis B blood test.  Sexually transmitted disease (STD) testing.  Diabetes screening. This is done by  checking your blood sugar (glucose) after you have not eaten for a while (fasting). You may have this done every 1-3 years.  Bone density scan. This is done to screen for osteoporosis. You may have this done starting at age 16.  Mammogram. This may be done every 1-2 years. Talk to your health care provider about how  often you should have regular mammograms. Talk with your health care provider about your test results, treatment options, and if necessary, the need for more tests. Vaccines Your health care provider may recommend certain vaccines, such as:  Influenza vaccine. This is recommended every year.  Tetanus, diphtheria, and acellular pertussis (Tdap, Td) vaccine. You may need a Td booster every 10 years.  Varicella vaccine. You may need this if you have not been vaccinated.  Zoster vaccine. You may need this after age 38.  Measles, mumps, and rubella (MMR) vaccine. You may need at least one dose of MMR if you were born in 1957 or later. You may also need a second dose.  Pneumococcal 13-valent conjugate (PCV13) vaccine. One dose is recommended after age 41.  Pneumococcal polysaccharide (PPSV23) vaccine. One dose is recommended after age 46.  Meningococcal vaccine. You may need this if you have certain conditions.  Hepatitis A vaccine. You may need this if you have certain conditions or if you travel or work in places where you may be exposed to hepatitis A.  Hepatitis B vaccine. You may need this if you have certain conditions or if you travel or work in places where you may be exposed to hepatitis B.  Haemophilus influenzae type b (Hib) vaccine. You may need this if you have certain conditions. Talk to your health care provider about which screenings and vaccines you need and how often you need them. This information is not intended to replace advice given to you by your health care provider. Make sure you discuss any questions you have with your health care provider. Document Released: 02/20/2015 Document Revised: 03/16/2017 Document Reviewed: 11/25/2014 Elsevier Interactive Patient Education  2019 [employer].

## 2018-03-14 NOTE — Assessment & Plan Note (Signed)
Compliant to pantoprazole 40 mg once daily and is well managed. Continue same.

## 2018-03-14 NOTE — Assessment & Plan Note (Signed)
Chronic. Taking Meloxicam daily and doesn't seem to notice improvement. Does take Tylenol on occasion.  Will have her come off of Meloxicam to see if she notices a difference. Will have her maximize tylenol BID-TID, she will update.

## 2018-03-14 NOTE — Assessment & Plan Note (Signed)
Recent lipid panel stable, continue atorvastatin 20 mg.

## 2018-03-14 NOTE — Assessment & Plan Note (Signed)
Stable in the office today, continue amlodipine 5 mg.  BMP reviewed.

## 2018-03-14 NOTE — Assessment & Plan Note (Signed)
Compliant to Singulair daily, using OTC nasal steroid PRN. Overall doing well.  Continue same.

## 2018-03-14 NOTE — Addendum Note (Signed)
Addended by: Tawnya Crook on: 03/14/2018 05:17 PM   Modules accepted: Orders

## 2018-03-14 NOTE — Assessment & Plan Note (Signed)
A1C of 5.7 in August 2019. Repeat A1C pending today.

## 2018-03-14 NOTE — Assessment & Plan Note (Signed)
Due for pneumonia and influenza vaccinations today. Rx for Shingrix provided to have done in one month. Declines colonoscopy, opts for Cologuard. Mammogram and bone density scan due, orders placed. Recommended regular exercise, healthy diet.  Exam unremarkable. Labs reviewed. Follow up in 1 year for CPE.

## 2018-03-14 NOTE — Progress Notes (Signed)
Subjective:    Patient ID: Laurel Dimmer, female    DOB: 1950/03/22, 68 y.o.   MRN: [id number]  HPI  Ms. Tumbarello is a 68 year old female who presents today for complete physical.  Immunizations: -Tetanus: Completed in 2019 -Influenza: Due today -Pneumonia: Never completed  -Shingles: Never completed   Diet: She endorses a fair diet Breakfast: Toast, cereal  Lunch: Sandwich, skips, left overs Dinner: Chicken nuggests, vegetables, frozen food, starch Snacks: None Desserts: Twice weekly Beverages: Sweet tea, water, coffee  Exercise: She is not exercising  Eye exam: Completed in 2019 Dental exam: Completes annually  Colonoscopy: Never completed and declines, opts for Cologuard Dexa: Never completed Mammogram: Completed 5-6 years ago  Hep C Screen: Completed in 2020  BP Readings from Last 3 Encounters:  03/14/18 116/74  02/28/18 122/70  11/28/17 128/82     Review of Systems  Constitutional: Negative for unexpected weight change.  HENT: Negative for rhinorrhea.   Respiratory: Negative for cough and shortness of breath.   Cardiovascular: Negative for chest pain.  Gastrointestinal: Negative for constipation and diarrhea.  Genitourinary: Negative for difficulty urinating.  Musculoskeletal: Negative for arthralgias and myalgias.  Skin: Negative for rash.  Allergic/Immunologic: Negative for environmental allergies.  Neurological: Negative for dizziness, numbness and headaches.  Psychiatric/Behavioral: The patient is not nervous/anxious.        Past Medical History:  Diagnosis Date  . Allergic rhinitis   . GERD (gastroesophageal reflux disease)   . Gout   . Hyperlipidemia   . Hypertension   . Osteoarthritis      Social History   Socioeconomic History  . Marital status: Single    Spouse name: Not on file  . Number of children: 2  . Years of education: Not on file  . Highest education level: Not on file  Occupational History  . Occupation: Print production planner    Social Needs  . Financial resource strain: Not on file  . Food insecurity:    Worry: Not on file    Inability: Not on file  . Transportation needs:    Medical: Not on file    Non-medical: Not on file  Tobacco Use  . Smoking status: Never Smoker  . Smokeless tobacco: Never Used  Substance and Sexual Activity  . Alcohol use: Yes    Comment: rarely  . Drug use: No  . Sexual activity: Not on file  Lifestyle  . Physical activity:    Days per week: Not on file    Minutes per session: Not on file  . Stress: Not on file  Relationships  . Social connections:    Talks on phone: Not on file    Gets together: Not on file    Attends religious service: Not on file    Active member of club or organization: Not on file    Attends meetings of clubs or organizations: Not on file    Relationship status: Not on file  . Intimate partner violence:    Fear of current or ex partner: Not on file    Emotionally abused: Not on file    Physically abused: Not on file    Forced sexual activity: Not on file  Other Topics Concern  . Not on file  Social History Narrative   Divorced   Print production planner   Two children, daughter and son    Past Surgical History:  Procedure Laterality Date  . KNEE SURGERY Right   . VAGINAL HYSTERECTOMY     left  ovary intact, DUB    Family History  Problem Relation Age of Onset  . Hyperlipidemia Mother   . Hypertension Mother   . Cancer Mother        breast  . Arthritis Mother   . Heart disease Father   . Emphysema Father   . Hypertension Sister   . Hypertension Brother   . Dementia Maternal Aunt        Alzheimers  . Dementia Maternal Aunt        Alzheimers    No Known Allergies  Current Outpatient Medications on File Prior to Visit  Medication Sig Dispense Refill  . amLODipine (NORVASC) 5 MG tablet Take 1 tablet by mouth once daily for high blood pressure. 90 tablet 3  . meloxicam (MOBIC) 7.5 MG tablet TAKE 1 TABLET BY MOUTH EVERY DAY AS NEEDED FOR  PAIN 90 tablet 0   No current facility-administered medications on file prior to visit.     BP 116/74   Pulse 80   Temp 97.8 F (36.6 C) (Oral)   Ht 5' 3.5" (1.613 m)   Wt 208 lb 4 oz (94.5 kg)   SpO2 97%   BMI 36.31 kg/m    Objective:   Physical Exam  Constitutional: She is oriented to person, place, and time. She appears well-nourished.  HENT:  Mouth/Throat: No oropharyngeal exudate.  Eyes: Pupils are equal, round, and reactive to light. EOM are normal.  Neck: Neck supple. No thyromegaly present.  Cardiovascular: Normal rate and regular rhythm.  Respiratory: Effort normal and breath sounds normal.  GI: Soft. Bowel sounds are normal. There is no abdominal tenderness.  Musculoskeletal: Normal range of motion.  Neurological: She is alert and oriented to person, place, and time.  Skin: Skin is warm and dry.  Psychiatric: She has a normal mood and affect.           Assessment & Plan:

## 2018-04-09 DIAGNOSIS — Z1212 Encounter for screening for malignant neoplasm of rectum: Secondary | ICD-10-CM | POA: Diagnosis not present

## 2018-04-09 DIAGNOSIS — Z1211 Encounter for screening for malignant neoplasm of colon: Secondary | ICD-10-CM | POA: Diagnosis not present

## 2018-04-12 ENCOUNTER — Encounter: Payer: Self-pay | Admitting: Primary Care

## 2018-04-12 LAB — COLOGUARD: Cologuard: NEGATIVE

## 2018-04-13 ENCOUNTER — Encounter: Payer: Self-pay | Admitting: Primary Care

## 2018-04-13 ENCOUNTER — Telehealth: Payer: Self-pay | Admitting: Primary Care

## 2018-04-13 NOTE — Telephone Encounter (Signed)
Please notify patient that her cologuard report was negative, we will repeat in 3 years.

## 2018-04-16 ENCOUNTER — Encounter: Payer: Self-pay | Admitting: *Deleted

## 2018-04-16 NOTE — Telephone Encounter (Signed)
Release letter for patient through MyChart. Sending letter with results and Graylon Gunning comments for patient.

## 2018-05-24 ENCOUNTER — Other Ambulatory Visit: Payer: Self-pay | Admitting: Primary Care

## 2018-05-24 DIAGNOSIS — M2550 Pain in unspecified joint: Secondary | ICD-10-CM

## 2018-08-16 ENCOUNTER — Other Ambulatory Visit: Payer: Self-pay | Admitting: Primary Care

## 2018-08-16 ENCOUNTER — Other Ambulatory Visit: Payer: Self-pay

## 2018-08-16 DIAGNOSIS — M2550 Pain in unspecified joint: Secondary | ICD-10-CM

## 2018-08-16 NOTE — Telephone Encounter (Signed)
Based off of our meeting in February she noted that the Meloxicam wasn't helping much. Has she tried taking the Meloxicam BID? If not we can try that.

## 2018-08-16 NOTE — Telephone Encounter (Signed)
Pt left a VM on triage line asking for a refill of meloxicam sent to [facility].

## 2018-08-17 MED ORDER — MELOXICAM 7.5 MG PO TABS: 7.5000 mg | ORAL_TABLET | Freq: Two times a day (BID) | ORAL | 0 refills | Status: DC | PRN

## 2018-08-17 NOTE — Telephone Encounter (Signed)
Spoken and notified patient of Crystal Hardin comments. Patient have not tried it BID but willing to try it to see. Please se Rx of Meloxicam BID to [facility]

## 2018-08-17 NOTE — Telephone Encounter (Signed)
Noted, refill sent to pharmacy. 

## 2018-09-17 ENCOUNTER — Other Ambulatory Visit: Payer: Medicare HMO

## 2018-11-08 DIAGNOSIS — R69 Illness, unspecified: Secondary | ICD-10-CM | POA: Diagnosis not present

## 2018-11-09 ENCOUNTER — Other Ambulatory Visit: Payer: Self-pay | Admitting: Primary Care

## 2018-11-09 DIAGNOSIS — M2550 Pain in unspecified joint: Secondary | ICD-10-CM

## 2018-11-16 ENCOUNTER — Other Ambulatory Visit: Payer: Self-pay | Admitting: Primary Care

## 2018-11-16 DIAGNOSIS — I10 Essential (primary) hypertension: Secondary | ICD-10-CM

## 2018-12-06 ENCOUNTER — Other Ambulatory Visit: Payer: Self-pay

## 2018-12-06 ENCOUNTER — Telehealth: Payer: Self-pay

## 2018-12-06 ENCOUNTER — Encounter: Payer: Self-pay | Admitting: Family Medicine

## 2018-12-06 ENCOUNTER — Ambulatory Visit (INDEPENDENT_AMBULATORY_CARE_PROVIDER_SITE_OTHER): Payer: Medicare HMO | Admitting: Family Medicine

## 2018-12-06 VITALS — BP 132/84 | HR 98 | Temp 97.3°F | Resp 14 | Ht 63.5 in | Wt 206.0 lb

## 2018-12-06 DIAGNOSIS — S39012A Strain of muscle, fascia and tendon of lower back, initial encounter: Secondary | ICD-10-CM

## 2018-12-06 DIAGNOSIS — K2100 Gastro-esophageal reflux disease with esophagitis, without bleeding: Secondary | ICD-10-CM

## 2018-12-06 HISTORY — DX: Strain of muscle, fascia and tendon of lower back, initial encounter: S39.012A

## 2018-12-06 MED ORDER — PANTOPRAZOLE SODIUM 40 MG PO TBEC: 40.0000 mg | DELAYED_RELEASE_TABLET | Freq: Every day | ORAL | 1 refills | Status: DC

## 2018-12-06 MED ORDER — CYCLOBENZAPRINE HCL 5 MG PO TABS: 5.0000 mg | ORAL_TABLET | Freq: Three times a day (TID) | ORAL | 0 refills | Status: DC | PRN

## 2018-12-06 NOTE — Patient Instructions (Signed)

## 2018-12-06 NOTE — Progress Notes (Signed)
   Subjective:     Crystal Hardin is a 68 y.o. female presenting for Back Pain (x 2 weeks. pain in the right buttocks area radiating down to right leg. no injury)     HPI  #Back pain - flares up 1 time per year - treatment: tylenol, heat, meloxicam - worse with: sitting, sudden movement - radiates: down the right leg - keeps her 18 mo grandson - not sleeping well - using heat patches at night which will help her fall asleep but worse - no loss of sensation or strength - no loss of control of bowel or bladder - hx of imaging in the past - just osteoarthritis  Has improved with muscle relaxants in the past   Review of Systems  Constitutional: Negative.   Genitourinary: Negative.   Musculoskeletal: Positive for back pain.     Social History   Tobacco Use  Smoking Status Never Smoker  Smokeless Tobacco Never Used        Objective:    BP Readings from Last 3 Encounters:  12/06/18 132/84  03/14/18 116/74  02/28/18 122/70   Wt Readings from Last 3 Encounters:  12/06/18 206 lb (93.4 kg)  03/14/18 208 lb 4 oz (94.5 kg)  02/28/18 207 lb 12 oz (94.2 kg)    BP 132/84   Pulse 98   Temp (!) 97.3 F (36.3 C)   Resp 14   Ht 5' 3.5" (1.613 m)   Wt 206 lb (93.4 kg)   SpO2 96%   BMI 35.92 kg/m    Physical Exam Constitutional:      General: She is not in acute distress.    Appearance: She is well-developed. She is not diaphoretic.     Comments: Changing position in seat regularly  HENT:     Right Ear: External ear normal.     Left Ear: External ear normal.     Nose: Nose normal.  Eyes:     Conjunctiva/sclera: Conjunctivae normal.  Neck:     Musculoskeletal: Neck supple.  Cardiovascular:     Rate and Rhythm: Normal rate.  Pulmonary:     Effort: Pulmonary effort is normal.  Musculoskeletal:     Comments: Back:  Inspection: no obvious abnormalities Palpation: no spinous or paraspinous TTP, TTP along the right lateral gluteal region ROM: normal,  though pain with some rotation in the right buttock region Strength: Right side decreased hip flexion, otherwise normal LE strength Straight leg raise: negative   Skin:    General: Skin is warm and dry.     Capillary Refill: Capillary refill takes less than 2 seconds.  Neurological:     Mental Status: She is alert. Mental status is at baseline.  Psychiatric:        Mood and Affect: Mood normal.        Behavior: Behavior normal.           Assessment & Plan:   Problem List Items Addressed This Visit      Musculoskeletal and Integument   Strain of lumbar region - Primary    Seems consistent with low back strain. Trial of muscle relaxants - discussed sedation risk. Home exercises. Discussed PT if symptoms persist.       Relevant Medications   cyclobenzaprine (FLEXERIL) 5 MG tablet       Return if symptoms worsen or fail to improve.  Lesleigh Noe, MD

## 2018-12-06 NOTE — Telephone Encounter (Signed)
For 2 wks pt having episode of sciatica; pain in rt lower back that radiates down rt leg; pt taking Tylenol and using heat without relief. Pt not able to sleep at night due to pain;pt request muscle relaxant. Pt scheduled appt today with Dr Einar Pheasant at 2:40 pt last annual 03/14/18. Pt has no covid symptoms, no travel and no known exposure to + covid.

## 2018-12-06 NOTE — Telephone Encounter (Signed)
See visit note from today

## 2018-12-06 NOTE — Assessment & Plan Note (Addendum)
Seems consistent with low back strain. Trial of muscle relaxants - discussed sedation risk. Home exercises. Discussed PT if symptoms persist.

## 2019-02-08 ENCOUNTER — Other Ambulatory Visit: Payer: Self-pay | Admitting: Primary Care

## 2019-02-08 DIAGNOSIS — M2550 Pain in unspecified joint: Secondary | ICD-10-CM

## 2019-02-25 ENCOUNTER — Other Ambulatory Visit: Payer: Self-pay | Admitting: Primary Care

## 2019-02-25 DIAGNOSIS — E7800 Pure hypercholesterolemia, unspecified: Secondary | ICD-10-CM

## 2019-02-25 DIAGNOSIS — R7303 Prediabetes: Secondary | ICD-10-CM

## 2019-03-04 ENCOUNTER — Ambulatory Visit: Payer: No Typology Code available for payment source

## 2019-03-04 ENCOUNTER — Other Ambulatory Visit: Payer: Self-pay

## 2019-03-04 ENCOUNTER — Other Ambulatory Visit (INDEPENDENT_AMBULATORY_CARE_PROVIDER_SITE_OTHER): Payer: Medicare HMO

## 2019-03-04 ENCOUNTER — Ambulatory Visit (INDEPENDENT_AMBULATORY_CARE_PROVIDER_SITE_OTHER): Payer: Medicare HMO

## 2019-03-04 VITALS — Wt 206.0 lb

## 2019-03-04 DIAGNOSIS — Z0000 Encounter for general adult medical examination without abnormal findings: Secondary | ICD-10-CM | POA: Diagnosis not present

## 2019-03-04 DIAGNOSIS — E7800 Pure hypercholesterolemia, unspecified: Secondary | ICD-10-CM | POA: Diagnosis not present

## 2019-03-04 DIAGNOSIS — R7303 Prediabetes: Secondary | ICD-10-CM

## 2019-03-04 LAB — HEMOGLOBIN A1C: Hgb A1c MFr Bld: 5.8 % (ref 4.6–6.5)

## 2019-03-04 LAB — COMPREHENSIVE METABOLIC PANEL
ALT: 14 U/L (ref 0–35)
AST: 15 U/L (ref 0–37)
Albumin: 3.9 g/dL (ref 3.5–5.2)
Alkaline Phosphatase: 62 U/L (ref 39–117)
BUN: 18 mg/dL (ref 6–23)
CO2: 30 mEq/L (ref 19–32)
Calcium: 9.3 mg/dL (ref 8.4–10.5)
Chloride: 104 mEq/L (ref 96–112)
Creatinine, Ser: 0.71 mg/dL (ref 0.40–1.20)
GFR: 81.91 mL/min (ref >60.00)
Glucose, Bld: 98 mg/dL (ref 70–99)
Potassium: 3.6 mEq/L (ref 3.5–5.1)
Sodium: 137 mEq/L (ref 135–145)
Total Bilirubin: 0.5 mg/dL (ref 0.2–1.2)
Total Protein: 6.9 g/dL (ref 6.0–8.3)

## 2019-03-04 LAB — LIPID PANEL
Cholesterol: 168 mg/dL (ref 0–200)
HDL: 56.3 mg/dL (ref >39.00)
LDL Cholesterol: 87 mg/dL (ref 0–99)
NonHDL: 111.78
Total CHOL/HDL Ratio: 3
Triglycerides: 124 mg/dL (ref 0.0–149.0)
VLDL: 24.8 mg/dL (ref 0.0–40.0)

## 2019-03-04 NOTE — Patient Instructions (Signed)
[name] , Thank you for taking time to come for your Medicare Wellness Visit. I appreciate your ongoing commitment to your health goals. Please review the following plan we discussed and let me know if I can assist you in the future.   Screening recommendations/referrals: Colonoscopy: Cologuard completed 04/09/2018 Mammogram: declined Bone Density: declined Recommended yearly ophthalmology/optometry visit for glaucoma screening and checkup Recommended yearly dental visit for hygiene and checkup  Vaccinations: Influenza vaccine: Up to date, completed 11/08/2018 Pneumococcal vaccine: Completed series Tdap vaccine: Up to date, completed 05/17/2017 Shingles vaccine: completed 11/08/2018    Advanced directives: Advance directive discussed with you today. I have provided a copy for you to complete at home and have notarized. Once this is complete please bring a copy in to our office so we can scan it into your chart.  Conditions/risks identified: hypertension, hypercholesterolemia  Next appointment: 03/18/2019 @ 8:20 am    Preventive Care 65 Years and Older, Female Preventive care refers to lifestyle choices and visits with your health care provider that can promote health and wellness. What does preventive care include?  A yearly physical exam. This is also called an annual well check.  Dental exams once or twice a year.  Routine eye exams. Ask your health care provider how often you should have your eyes checked.  Personal lifestyle choices, including:  Daily care of your teeth and gums.  Regular physical activity.  Eating a healthy diet.  Avoiding tobacco and drug use.  Limiting alcohol use.  Practicing safe sex.  Taking low-dose aspirin every day.  Taking vitamin and mineral supplements as recommended by your health care provider. What happens during an annual well check? The services and screenings done by your health care provider during your annual well check will  depend on your age, overall health, lifestyle risk factors, and family history of disease. Counseling  Your health care provider may ask you questions about your:  Alcohol use.  Tobacco use.  Drug use.  Emotional well-being.  Home and relationship well-being.  Sexual activity.  Eating habits.  History of falls.  Memory and ability to understand (cognition).  Work and work Astronomer.  Reproductive health. Screening  You may have the following tests or measurements:  Height, weight, and BMI.  Blood pressure.  Lipid and cholesterol levels. These may be checked every 5 years, or more frequently if you are over 53 years old.  Skin check.  Lung cancer screening. You may have this screening every year starting at age 70 if you have a 30-pack-year history of smoking and currently smoke or have quit within the past 15 years.  Fecal occult blood test (FOBT) of the stool. You may have this test every year starting at age 92.  Flexible sigmoidoscopy or colonoscopy. You may have a sigmoidoscopy every 5 years or a colonoscopy every 10 years starting at age 77.  Hepatitis C blood test.  Hepatitis B blood test.  Sexually transmitted disease (STD) testing.  Diabetes screening. This is done by checking your blood sugar (glucose) after you have not eaten for a while (fasting). You may have this done every 1-3 years.  Bone density scan. This is done to screen for osteoporosis. You may have this done starting at age 76.  Mammogram. This may be done every 1-2 years. Talk to your health care provider about how often you should have regular mammograms. Talk with your health care provider about your test results, treatment options, and if necessary, the need for more  tests. Vaccines  Your health care provider may recommend certain vaccines, such as:  Influenza vaccine. This is recommended every year.  Tetanus, diphtheria, and acellular pertussis (Tdap, Td) vaccine. You may need a  Td booster every 10 years.  Zoster vaccine. You may need this after age 20.  Pneumococcal 13-valent conjugate (PCV13) vaccine. One dose is recommended after age 13.  Pneumococcal polysaccharide (PPSV23) vaccine. One dose is recommended after age 72. Talk to your health care provider about which screenings and vaccines you need and how often you need them. This information is not intended to replace advice given to you by your health care provider. Make sure you discuss any questions you have with your health care provider. Document Released: 02/20/2015 Document Revised: 10/14/2015 Document Reviewed: 11/25/2014 Elsevier Interactive Patient Education  2017 [place] Prevention in the Home Falls can cause injuries. They can happen to people of all ages. There are many things you can do to make your home safe and to help prevent falls. What can I do on the outside of my home?  Regularly fix the edges of walkways and driveways and fix any cracks.  Remove anything that might make you trip as you walk through a door, such as a raised step or threshold.  Trim any bushes or trees on the path to your home.  Use bright outdoor lighting.  Clear any walking paths of anything that might make someone trip, such as rocks or tools.  Regularly check to see if handrails are loose or broken. Make sure that both sides of any steps have handrails.  Any raised decks and porches should have guardrails on the edges.  Have any leaves, snow, or ice cleared regularly.  Use sand or salt on walking paths during winter.  Clean up any spills in your garage right away. This includes oil or grease spills. What can I do in the bathroom?  Use night lights.  Install grab bars by the toilet and in the tub and shower. Do not use towel bars as grab bars.  Use non-skid mats or decals in the tub or shower.  If you need to sit down in the shower, use a plastic, non-slip stool.  Keep the floor dry. Clean  up any water that spills on the floor as soon as it happens.  Remove soap buildup in the tub or shower regularly.  Attach bath mats securely with double-sided non-slip rug tape.  Do not have throw rugs and other things on the floor that can make you trip. What can I do in the bedroom?  Use night lights.  Make sure that you have a light by your bed that is easy to reach.  Do not use any sheets or blankets that are too big for your bed. They should not hang down onto the floor.  Have a firm chair that has side arms. You can use this for support while you get dressed.  Do not have throw rugs and other things on the floor that can make you trip. What can I do in the kitchen?  Clean up any spills right away.  Avoid walking on wet floors.  Keep items that you use a lot in easy-to-reach places.  If you need to reach something above you, use a strong step stool that has a grab bar.  Keep electrical cords out of the way.  Do not use floor polish or wax that makes floors slippery. If you must use wax, use non-skid floor  wax.  Do not have throw rugs and other things on the floor that can make you trip. What can I do with my stairs?  Do not leave any items on the stairs.  Make sure that there are handrails on both sides of the stairs and use them. Fix handrails that are broken or loose. Make sure that handrails are as long as the stairways.  Check any carpeting to make sure that it is firmly attached to the stairs. Fix any carpet that is loose or worn.  Avoid having throw rugs at the top or bottom of the stairs. If you do have throw rugs, attach them to the floor with carpet tape.  Make sure that you have a light switch at the top of the stairs and the bottom of the stairs. If you do not have them, ask someone to add them for you. What else can I do to help prevent falls?  Wear shoes that:  Do not have high heels.  Have rubber bottoms.  Are comfortable and fit you well.  Are  closed at the toe. Do not wear sandals.  If you use a stepladder:  Make sure that it is fully opened. Do not climb a closed stepladder.  Make sure that both sides of the stepladder are locked into place.  Ask someone to hold it for you, if possible.  Clearly mark and make sure that you can see:  Any grab bars or handrails.  First and last steps.  Where the edge of each step is.  Use tools that help you move around (mobility aids) if they are needed. These include:  Canes.  Walkers.  Scooters.  Crutches.  Turn on the lights when you go into a dark area. Replace any light bulbs as soon as they burn out.  Set up your furniture so you have a clear path. Avoid moving your furniture around.  If any of your floors are uneven, fix them.  If there are any pets around you, be aware of where they are.  Review your medicines with your doctor. Some medicines can make you feel dizzy. This can increase your chance of falling. Ask your doctor what other things that you can do to help prevent falls. This information is not intended to replace advice given to you by your health care provider. Make sure you discuss any questions you have with your health care provider. Document Released: 11/20/2008 Document Revised: 07/02/2015 Document Reviewed: 02/28/2014 Elsevier Interactive Patient Education  2017 [employer].

## 2019-03-04 NOTE — Progress Notes (Signed)
PCP notes:  Health Maintenance: Declined mammogram and dexa due to covid   Abnormal Screenings: none   Patient concerns: Patient would like her hearing checked.    Nurse concerns: none   Next PCP appt.: 03/18/2019 @ 8:20 am

## 2019-03-04 NOTE — Progress Notes (Signed)
Subjective:   Crystal Hardin is a 69 y.o. female who presents for Medicare Annual (Subsequent) preventive examination.  Review of Systems: N/A   This visit is being conducted through telemedicine via telephone at the nurse health advisor's home address due to the COVID-19 pandemic. This patient has given me verbal consent via doximity to conduct this visit, patient states they are participating from their home address. Patient and myself are on the telephone call. There is no referral for this visit. Some vital signs may be absent or patient reported.    Patient identification: identified by name, DOB, and current address   Cardiac Risk Factors include: advanced age (>72men, >20 women);hypertension;Other (see comment), Risk factor comments: hypercholesterolemia     Objective:     Vitals: Wt 206 lb (93.4 kg)   BMI 35.92 kg/m   Body mass index is 35.92 kg/m.  Advanced Directives 03/04/2019 02/28/2018 06/06/2016 03/21/2015  Does Patient Have a Medical Advance Directive? No No No No  Would patient like information on creating a medical advance directive? Yes (MAU/Ambulatory/Procedural Areas - Information given) No - Patient declined No - Patient declined Yes - Scientist, clinical (histocompatibility and immunogenetics) given    Tobacco Social History   Tobacco Use  Smoking Status Never Smoker  Smokeless Tobacco Never Used     Counseling given: Not Answered   Clinical Intake:  Pre-visit preparation completed: Yes  Pain : No/denies pain     Nutritional Risks: None Diabetes: No  How often do you need to have someone help you when you read instructions, pamphlets, or other written materials from your doctor or pharmacy?: 1 - Never What is the last grade level you completed in school?: 12th  Interpreter Needed?: No  Information entered by :: CJohnson, LPN  Past Medical History:  Diagnosis Date  . Allergic rhinitis   . GERD (gastroesophageal reflux disease)   . Gout   . Hyperlipidemia   . Hypertension    . Osteoarthritis    Past Surgical History:  Procedure Laterality Date  . KNEE SURGERY Right   . VAGINAL HYSTERECTOMY     left ovary intact, DUB   Family History  Problem Relation Age of Onset  . Hyperlipidemia Mother   . Hypertension Mother   . Cancer Mother        breast  . Arthritis Mother   . Heart disease Father   . Emphysema Father   . Hypertension Sister   . Hypertension Brother   . Dementia Maternal Aunt        Alzheimers  . Dementia Maternal Aunt        Alzheimers   Social History   Socioeconomic History  . Marital status: Single    Spouse name: Not on file  . Number of children: 2  . Years of education: Not on file  . Highest education level: Not on file  Occupational History  . Occupation: Glass blower/designer  Tobacco Use  . Smoking status: Never Smoker  . Smokeless tobacco: Never Used  Substance and Sexual Activity  . Alcohol use: Not Currently    Comment: rarely  . Drug use: No  . Sexual activity: Not on file  Other Topics Concern  . Not on file  Social History Narrative   Divorced   Glass blower/designer   Two children, daughter and son   Social Determinants of Health   Financial Resource Strain: Low Risk   . Difficulty of Paying Living Expenses: Not hard at all  Food Insecurity: No Food Insecurity  .  Worried About Programme researcher, broadcasting/film/video in the Last Year: Never true  . Ran Out of Food in the Last Year: Never true  Transportation Needs: No Transportation Needs  . Lack of Transportation (Medical): No  . Lack of Transportation (Non-Medical): No  Physical Activity: Inactive  . Days of Exercise per Week: 0 days  . Minutes of Exercise per Session: 0 min  Stress: No Stress Concern Present  . Feeling of Stress : Not at all  Social Connections:   . Frequency of Communication with Friends and Family: Not on file  . Frequency of Social Gatherings with Friends and Family: Not on file  . Attends Religious Services: Not on file  . Active Member of Clubs or  Organizations: Not on file  . Attends Banker Meetings: Not on file  . Marital Status: Not on file    Outpatient Encounter Medications as of 03/04/2019  Medication Sig  . amLODipine (NORVASC) 5 MG tablet TAKE 1 TABLET BY MOUTH ONCE DAILY FOR HIGH BLOOD PRESSURE.  Marland Kitchen atorvastatin (LIPITOR) 20 MG tablet Take 1 tablet by mouth once daily for cholesterol.  . cyclobenzaprine (FLEXERIL) 5 MG tablet Take 1 tablet (5 mg total) by mouth 3 (three) times daily as needed for muscle spasms.  . meloxicam (MOBIC) 7.5 MG tablet TAKE 1 TABLET (7.5 MG TOTAL) BY MOUTH 2 (TWO) TIMES DAILY AS NEEDED FOR PAIN.  . montelukast (SINGULAIR) 10 MG tablet Take 1 tablet by mouth once daily for allergies.  . pantoprazole (PROTONIX) 40 MG tablet Take 1 tablet (40 mg total) by mouth daily. For heartburn.   No facility-administered encounter medications on file as of 03/04/2019.    Activities of Daily Living In your present state of health, do you have any difficulty performing the following activities: 03/04/2019  Hearing? N  Vision? N  Difficulty concentrating or making decisions? N  Walking or climbing stairs? N  Dressing or bathing? N  Doing errands, shopping? N  Preparing Food and eating ? N  Using the Toilet? N  In the past six months, have you accidently leaked urine? N  Do you have problems with loss of bowel control? N  Managing your Medications? N  Managing your Finances? N  Housekeeping or managing your Housekeeping? N  Some recent data might be hidden    Patient Care Team: Doreene Nest, NP as PCP - General (Internal Medicine)    Assessment:   This is a routine wellness examination for Crystal Hardin.  Exercise Activities and Dietary recommendations Current Exercise Habits: The patient does not participate in regular exercise at present, Exercise limited by: None identified  Goals    . Patient Stated     Starting 02/28/18, I will continue to take medications as prescribed.     .  Patient Stated     03/04/2019, I will maintain and continue medications as prescribed.       Fall Risk Fall Risk  03/04/2019 02/28/2018  Falls in the past year? 1 1  Comment tripped and fell pt tripped over a stool and went to ER  Number falls in past yr: 0 0  Injury with Fall? 0 1  Risk for fall due to : Medication side effect -  Follow up Falls evaluation completed;Falls prevention discussed -   Is the patient's home free of loose throw rugs in walkways, pet beds, electrical cords, etc?   yes      Grab bars in the bathroom? no      Handrails  on the stairs?   yes      Adequate lighting?   yes  Timed Get Up and Go performed: N/A  Depression Screen PHQ 2/9 Scores 03/04/2019 02/28/2018  PHQ - 2 Score 0 0  PHQ- 9 Score 0 0     Cognitive Function MMSE - Mini Mental State Exam 03/04/2019 02/28/2018  Orientation to time 5 5  Orientation to Place 5 5  Registration 3 3  Attention/ Calculation 5 0  Recall 3 3  Language- name 2 objects - 0  Language- repeat 1 1  Language- follow 3 step command - 3  Language- read & follow direction - 0  Write a sentence - 0  Copy design - 0  Total score - 20  Mini Cog  Mini-Cog screen was completed. Maximum score is 22. A value of 0 denotes this part of the MMSE was not completed or the patient failed this part of the Mini-Cog screening.       Immunization History  Administered Date(s) Administered  . Influenza, High Dose Seasonal PF 11/08/2018  . Influenza, Seasonal, Injecte, Preservative Fre 12/08/2015  . Influenza,inj,Quad PF,6+ Mos 10/21/2013, 11/04/2014, 03/14/2018  . Pneumococcal Polysaccharide-23 03/14/2018  . Td 01/18/2006  . Tdap 05/17/2017  . Zoster Recombinat (Shingrix) 11/08/2018    Qualifies for Shingles Vaccine? Completed 11/08/2018  Screening Tests Health Maintenance  Topic Date Due  . MAMMOGRAM  02/07/2020 (Originally 06/22/2012)  . DEXA SCAN  02/07/2020 (Originally 05/09/2016)  . PNA vac Low Risk Adult (2 of 2 - PCV13)  03/15/2019  . Fecal DNA (Cologuard)  04/08/2021  . TETANUS/TDAP  05/18/2027  . INFLUENZA VACCINE  Completed  . Hepatitis C Screening  Completed    Cancer Screenings: Lung: Low Dose CT Chest recommended if Age 12-80 years, 30 pack-year currently smoking OR have quit w/in 15years. Patient does not qualify. Breast:  Up to date on Mammogram? No, declined due to covid   Up to date of Bone Density/Dexa? No, declined due to covid  Colorectal: Cologuard completed 04/09/2018  Additional Screenings:  Hepatitis C Screening: 02/28/2018     Plan:   Patient will maintain and continue medications as prescribed.    I have personally reviewed and noted the following in the patient's chart:   . Medical and social history . Use of alcohol, tobacco or illicit drugs  . Current medications and supplements . Functional ability and status . Nutritional status . Physical activity . Advanced directives . List of other physicians . Hospitalizations, surgeries, and ER visits in previous 12 months . Vitals . Screenings to include cognitive, depression, and falls . Referrals and appointments  In addition, I have reviewed and discussed with patient certain preventive protocols, quality metrics, and best practice recommendations. A written personalized care plan for preventive services as well as general preventive health recommendations were provided to patient.     Janalyn Shy, LPN  05/16/8117

## 2019-03-18 ENCOUNTER — Other Ambulatory Visit: Payer: Self-pay

## 2019-03-18 ENCOUNTER — Encounter: Payer: Self-pay | Admitting: Primary Care

## 2019-03-18 ENCOUNTER — Ambulatory Visit (INDEPENDENT_AMBULATORY_CARE_PROVIDER_SITE_OTHER): Payer: Medicare HMO | Admitting: Primary Care

## 2019-03-18 VITALS — BP 130/84 | HR 96 | Temp 96.1°F | Ht 63.5 in | Wt 207.5 lb

## 2019-03-18 DIAGNOSIS — M2550 Pain in unspecified joint: Secondary | ICD-10-CM | POA: Diagnosis not present

## 2019-03-18 DIAGNOSIS — I10 Essential (primary) hypertension: Secondary | ICD-10-CM | POA: Diagnosis not present

## 2019-03-18 DIAGNOSIS — Z23 Encounter for immunization: Secondary | ICD-10-CM | POA: Diagnosis not present

## 2019-03-18 DIAGNOSIS — Z0000 Encounter for general adult medical examination without abnormal findings: Secondary | ICD-10-CM | POA: Diagnosis not present

## 2019-03-18 DIAGNOSIS — K2100 Gastro-esophageal reflux disease with esophagitis, without bleeding: Secondary | ICD-10-CM

## 2019-03-18 DIAGNOSIS — E7800 Pure hypercholesterolemia, unspecified: Secondary | ICD-10-CM

## 2019-03-18 DIAGNOSIS — R7303 Prediabetes: Secondary | ICD-10-CM

## 2019-03-18 DIAGNOSIS — J309 Allergic rhinitis, unspecified: Secondary | ICD-10-CM | POA: Diagnosis not present

## 2019-03-18 DIAGNOSIS — S39012A Strain of muscle, fascia and tendon of lower back, initial encounter: Secondary | ICD-10-CM

## 2019-03-18 MED ORDER — ATORVASTATIN CALCIUM 20 MG PO TABS: ORAL_TABLET | ORAL | 3 refills | Status: DC

## 2019-03-18 MED ORDER — MONTELUKAST SODIUM 10 MG PO TABS: ORAL_TABLET | ORAL | 3 refills | Status: DC

## 2019-03-18 MED ORDER — AMLODIPINE BESYLATE 5 MG PO TABS: 5.0000 mg | ORAL_TABLET | Freq: Every day | ORAL | 1 refills | Status: DC

## 2019-03-18 MED ORDER — MELOXICAM 7.5 MG PO TABS: 7.5000 mg | ORAL_TABLET | Freq: Two times a day (BID) | ORAL | 0 refills | Status: DC | PRN

## 2019-03-18 NOTE — Assessment & Plan Note (Signed)
A1C on recent labs of 5.8. Continue to monitor annually.  Discussed the importance of a healthy diet and regular exercise in order for weight loss, and to reduce the risk of any potential medical problems.

## 2019-03-18 NOTE — Assessment & Plan Note (Signed)
Using Meloxicam as needed, continue same.

## 2019-03-18 NOTE — Assessment & Plan Note (Signed)
Compliant to statin therapy, LDL stable. Continue to monitor.

## 2019-03-18 NOTE — Patient Instructions (Signed)
Start exercising. You should be getting 150 minutes of moderate intensity exercise weekly.  It's important to improve your diet by reducing consumption of fast food, fried food, processed snack foods, sugary drinks. Increase consumption of fresh vegetables and fruits, whole grains, water.  Ensure you are drinking 64 ounces of water daily.  Reduce your pantoprazole to 1/2 tablet daily for 2 weeks, then 1/2 tablet every other day for 2 weeks, then stop.  It was a pleasure to see you today!   Preventive Care 68 Years and Older, Female Preventive care refers to lifestyle choices and visits with your health care provider that can promote health and wellness. This includes:  A yearly physical exam. This is also called an annual well check.  Regular dental and eye exams.  Immunizations.  Screening for certain conditions.  Healthy lifestyle choices, such as diet and exercise. What can I expect for my preventive care visit? Physical exam Your health care provider will check:  Height and weight. These may be used to calculate body mass index (BMI), which is a measurement that tells if you are at a healthy weight.  Heart rate and blood pressure.  Your skin for abnormal spots. Counseling Your health care provider may ask you questions about:  Alcohol, tobacco, and drug use.  Emotional well-being.  Home and relationship well-being.  Sexual activity.  Eating habits.  History of falls.  Memory and ability to understand (cognition).  Work and work Statistician.  Pregnancy and menstrual history. What immunizations do I need?  Influenza (flu) vaccine  This is recommended every year. Tetanus, diphtheria, and pertussis (Tdap) vaccine  You may need a Td booster every 10 years. Varicella (chickenpox) vaccine  You may need this vaccine if you have not already been vaccinated. Zoster (shingles) vaccine  You may need this after age 36. Pneumococcal conjugate (PCV13)  vaccine  One dose is recommended after age 68. Pneumococcal polysaccharide (PPSV23) vaccine  One dose is recommended after age 68. Measles, mumps, and rubella (MMR) vaccine  You may need at least one dose of MMR if you were born in 1957 or later. You may also need a second dose. Meningococcal conjugate (MenACWY) vaccine  You may need this if you have certain conditions. Hepatitis A vaccine  You may need this if you have certain conditions or if you travel or work in places where you may be exposed to hepatitis A. Hepatitis B vaccine  You may need this if you have certain conditions or if you travel or work in places where you may be exposed to hepatitis B. Haemophilus influenzae type b (Hib) vaccine  You may need this if you have certain conditions. You may receive vaccines as individual doses or as more than one vaccine together in one shot (combination vaccines). Talk with your health care provider about the risks and benefits of combination vaccines. What tests do I need? Blood tests  Lipid and cholesterol levels. These may be checked every 5 years, or more frequently depending on your overall health.  Hepatitis C test.  Hepatitis B test. Screening  Lung cancer screening. You may have this screening every year starting at age 15 if you have a 30-pack-year history of smoking and currently smoke or have quit within the past 15 years.  Colorectal cancer screening. All adults should have this screening starting at age 85 and continuing until age 73. Your health care provider may recommend screening at age 76 if you are at increased risk. You will have tests  every 1-10 years, depending on your results and the type of screening test.  Diabetes screening. This is done by checking your blood sugar (glucose) after you have not eaten for a while (fasting). You may have this done every 1-3 years.  Mammogram. This may be done every 1-2 years. Talk with your health care provider about how  often you should have regular mammograms.  BRCA-related cancer screening. This may be done if you have a family history of breast, ovarian, tubal, or peritoneal cancers. Other tests  Sexually transmitted disease (STD) testing.  Bone density scan. This is done to screen for osteoporosis. You may have this done starting at age 68. Follow these instructions at home: Eating and drinking  Eat a diet that includes fresh fruits and vegetables, whole grains, lean protein, and low-fat dairy products. Limit your intake of foods with high amounts of sugar, saturated fats, and salt.  Take vitamin and mineral supplements as recommended by your health care provider.  Do not drink alcohol if your health care provider tells you not to drink.  If you drink alcohol: ? Limit how much you have to 0-1 drink a day. ? Be aware of how much alcohol is in your drink. In the [place] one drink equals one 12 oz bottle of beer (355 mL), one 5 oz glass of wine (148 mL), or one 1 oz glass of hard liquor (44 mL). Lifestyle  Take daily care of your teeth and gums.  Stay active. Exercise for at least 30 minutes on 5 or more days each week.  Do not use any products that contain nicotine or tobacco, such as cigarettes, e-cigarettes, and chewing tobacco. If you need help quitting, ask your health care provider.  If you are sexually active, practice safe sex. Use a condom or other form of protection in order to prevent STIs (sexually transmitted infections).  Talk with your health care provider about taking a low-dose aspirin or statin. What's next?  Go to your health care provider once a year for a well check visit.  Ask your health care provider how often you should have your eyes and teeth checked.  Stay up to date on all vaccines. This information is not intended to replace advice given to you by your health care provider. Make sure you discuss any questions you have with your health care provider. Document  Revised: 01/18/2018 Document Reviewed: 01/18/2018 Elsevier Patient Education  2020 [employer].

## 2019-03-18 NOTE — Progress Notes (Signed)
Subjective:    Patient ID: Crystal Hardin, female    DOB: 1950/06/04, 69 y.o.   MRN: [id number]  HPI  This visit occurred during the SARS-CoV-2 public health emergency.  Safety protocols were in place, including screening questions prior to the visit, additional usage of staff PPE, and extensive cleaning of exam room while observing appropriate contact time as indicated for disinfecting solutions.   Crystal Hardin is a 69 year old female who presents today for complete physical.  Immunizations: -Tetanus: Completed in 2019 -Influenza: Completed this season  -Shingles: Completed first dose in October 2020   Diet: She endorses a fair diet. Exercise: She is not exercising.  Eye exam: Scheduled for March 2021 Dental exam: Completed in 2020  Mammogram: Declines Dexa: Declines  Colonoscopy: Completed Cologuard in 2020 Hep C Screen: Negative  BP Readings from Last 3 Encounters:  03/18/19 130/84  12/06/18 132/84  03/14/18 116/74      Review of Systems  Constitutional: Negative for unexpected weight change.  HENT: Negative for rhinorrhea.   Respiratory: Negative for cough and shortness of breath.   Cardiovascular: Negative for chest pain.  Gastrointestinal: Negative for constipation and diarrhea.  Genitourinary: Negative for difficulty urinating.  Musculoskeletal: Positive for arthralgias.  Skin: Negative for rash.  Allergic/Immunologic: Negative for environmental allergies.  Neurological: Negative for dizziness and headaches.  Psychiatric/Behavioral: The patient is not nervous/anxious.        Past Medical History:  Diagnosis Date  . Allergic rhinitis   . GERD (gastroesophageal reflux disease)   . Gout   . Hyperlipidemia   . Hypertension   . Osteoarthritis      Social History   Socioeconomic History  . Marital status: Single    Spouse name: Not on file  . Number of children: 2  . Years of education: Not on file  . Highest education level: Not on file    Occupational History  . Occupation: Print production planner  Tobacco Use  . Smoking status: Never Smoker  . Smokeless tobacco: Never Used  Substance and Sexual Activity  . Alcohol use: Not Currently    Comment: rarely  . Drug use: No  . Sexual activity: Not on file  Other Topics Concern  . Not on file  Social History Narrative   Divorced   Print production planner   Two children, daughter and son   Social Determinants of Health   Financial Resource Strain: Low Risk   . Difficulty of Paying Living Expenses: Not hard at all  Food Insecurity: No Food Insecurity  . Worried About Programme researcher, broadcasting/film/video in the Last Year: Never true  . Ran Out of Food in the Last Year: Never true  Transportation Needs: No Transportation Needs  . Lack of Transportation (Medical): No  . Lack of Transportation (Non-Medical): No  Physical Activity: Inactive  . Days of Exercise per Week: 0 days  . Minutes of Exercise per Session: 0 min  Stress: No Stress Concern Present  . Feeling of Stress : Not at all  Social Connections:   . Frequency of Communication with Friends and Family: Not on file  . Frequency of Social Gatherings with Friends and Family: Not on file  . Attends Religious Services: Not on file  . Active Member of Clubs or Organizations: Not on file  . Attends Banker Meetings: Not on file  . Marital Status: Not on file  Intimate Partner Violence: Not At Risk  . Fear of Current or Ex-Partner: No  .  Emotionally Abused: No  . Physically Abused: No  . Sexually Abused: No    Past Surgical History:  Procedure Laterality Date  . KNEE SURGERY Right   . VAGINAL HYSTERECTOMY     left ovary intact, DUB    Family History  Problem Relation Age of Onset  . Hyperlipidemia Mother   . Hypertension Mother   . Cancer Mother        breast  . Arthritis Mother   . Heart disease Father   . Emphysema Father   . Hypertension Sister   . Hypertension Brother   . Dementia Maternal Aunt        Alzheimers   . Dementia Maternal Aunt        Alzheimers    No Known Allergies  Current Outpatient Medications on File Prior to Visit  Medication Sig Dispense Refill  . amLODipine (NORVASC) 5 MG tablet TAKE 1 TABLET BY MOUTH ONCE DAILY FOR HIGH BLOOD PRESSURE. 90 tablet 1  . atorvastatin (LIPITOR) 20 MG tablet Take 1 tablet by mouth once daily for cholesterol. 90 tablet 3  . meloxicam (MOBIC) 7.5 MG tablet TAKE 1 TABLET (7.5 MG TOTAL) BY MOUTH 2 (TWO) TIMES DAILY AS NEEDED FOR PAIN. 180 tablet 0  . montelukast (SINGULAIR) 10 MG tablet Take 1 tablet by mouth once daily for allergies. 90 tablet 3   No current facility-administered medications on file prior to visit.    BP 130/84   Pulse 96   Temp (!) 96.1 F (35.6 C) (Temporal)   Ht 5' 3.5" (1.613 m)   Wt 207 lb 8 oz (94.1 kg)   SpO2 98%   BMI 36.18 kg/m    Objective:   Physical Exam  Constitutional: She is oriented to person, place, and time. She appears well-nourished.  HENT:  Right Ear: Tympanic membrane and ear canal normal.  Left Ear: Tympanic membrane and ear canal normal.  Mouth/Throat: Oropharynx is clear and moist.  Eyes: Pupils are equal, round, and reactive to light. EOM are normal.  Cardiovascular: Normal rate and regular rhythm.  Respiratory: Effort normal and breath sounds normal.  GI: Soft. Bowel sounds are normal. There is no abdominal tenderness.  Musculoskeletal:        General: Normal range of motion.     Cervical back: Neck supple.  Neurological: She is alert and oriented to person, place, and time. No cranial nerve deficit.  Reflex Scores:      Patellar reflexes are 2+ on the right side and 2+ on the left side. Skin: Skin is warm and dry.  Psychiatric: She has a normal mood and affect.           Assessment & Plan:

## 2019-03-18 NOTE — Assessment & Plan Note (Signed)
Denies symptoms of GERD on pantoprazole 40 mg. Would rather her not be on chronic PPI therapy, especially at this dose.   Will have her reduce dose to 20 mg daily x 2 weeks, then 20 mg every other day x 2 weeks then stop. Use Tums in between for breakthrough symptoms.   She will update if daily symptoms reoccur.

## 2019-03-18 NOTE — Assessment & Plan Note (Signed)
Stable, using Singulair and doing well. Continue same.

## 2019-03-18 NOTE — Addendum Note (Signed)
Addended by: Tawnya Crook on: 03/18/2019 09:26 AM   Modules accepted: Orders

## 2019-03-18 NOTE — Addendum Note (Signed)
Addended by: Tawnya Crook on: 03/18/2019 12:24 PM   Modules accepted: Orders

## 2019-03-18 NOTE — Assessment & Plan Note (Signed)
Tetanus UTD, first Shingrix UTD, Prevnar provided today.  Mammogram and bone density scan due, she kindly declines due to Covid-19. She will notify when ready. Colon cancer screening UTD, due in 2023. Encouraged a healthy diet, regular exercise. Exam today benign. Labs reviewed.

## 2019-03-18 NOTE — Assessment & Plan Note (Signed)
Intermittent symptoms over the years, using Meloxicam and cyclobenzaprine as needed. She will notify us if she needs refills of her cyclobenzaprine. Will remove from med list for now.

## 2019-03-18 NOTE — Assessment & Plan Note (Signed)
Stable in the office today, continue amlodipine.

## 2019-04-07 ENCOUNTER — Ambulatory Visit: Payer: Medicare HMO | Attending: Internal Medicine

## 2019-04-07 DIAGNOSIS — Z23 Encounter for immunization: Secondary | ICD-10-CM | POA: Insufficient documentation

## 2019-04-07 NOTE — Progress Notes (Signed)
   Covid-19 Vaccination Clinic  Name:  Crystal Hardin    MRN: [id number] DOB: [birth year]  04/07/2019  Crystal Hardin was observed post Covid-19 immunization for 15 minutes without incidence. She was provided with Vaccine Information Sheet and instruction to access the V-Safe system.   Crystal Hardin was instructed to call 911 with any severe reactions post vaccine: Marland Kitchen Difficulty breathing  . Swelling of your face and throat  . A fast heartbeat  . A bad rash all over your body  . Dizziness and weakness    Immunizations Administered    Name Date Dose VIS Date Route   Pfizer COVID-19 Vaccine 04/07/2019  9:42 AM 0.3 mL 01/18/2019 Intramuscular   Manufacturer: [employer], [employer]   Lot: [id number]   NDC: [address]-7

## 2019-04-29 ENCOUNTER — Ambulatory Visit: Payer: Medicare HMO | Attending: Internal Medicine

## 2019-04-29 DIAGNOSIS — Z23 Encounter for immunization: Secondary | ICD-10-CM

## 2019-04-29 NOTE — Progress Notes (Signed)
   Covid-19 Vaccination Clinic  Name:  Crystal Hardin    MRN: [id number] DOB: 05/03/1950  04/29/2019  [name] was observed post Covid-19 immunization for 15 minutes without incident. She was provided with Vaccine Information Sheet and instruction to access the V-Safe system.   [name] was instructed to call 911 with any severe reactions post vaccine: Marland Kitchen Difficulty breathing  . Swelling of face and throat  . A fast heartbeat  . A bad rash all over body  . Dizziness and weakness   Immunizations Administered    Name Date Dose VIS Date Route   Pfizer COVID-19 Vaccine 04/29/2019  9:14 AM 0.3 mL 01/18/2019 Intramuscular   Manufacturer: [employer], [employer]   Lot: [id number]   NDC: [address]-9

## 2019-05-27 ENCOUNTER — Other Ambulatory Visit: Payer: Self-pay | Admitting: Primary Care

## 2019-05-27 DIAGNOSIS — K2100 Gastro-esophageal reflux disease with esophagitis, without bleeding: Secondary | ICD-10-CM

## 2019-08-07 ENCOUNTER — Other Ambulatory Visit: Payer: Self-pay | Admitting: Primary Care

## 2019-08-07 DIAGNOSIS — M2550 Pain in unspecified joint: Secondary | ICD-10-CM

## 2019-08-07 NOTE — Telephone Encounter (Signed)
Refills sent to pharmacy. 

## 2019-08-07 NOTE — Telephone Encounter (Signed)
Last prescribed on 03/18/2019 Last OV (cpe) with Mayra Reel on 03/18/2019  No future OV scheduled

## 2019-10-15 ENCOUNTER — Other Ambulatory Visit: Payer: Self-pay | Admitting: Primary Care

## 2019-10-15 DIAGNOSIS — I10 Essential (primary) hypertension: Secondary | ICD-10-CM

## 2019-10-30 ENCOUNTER — Other Ambulatory Visit: Payer: Self-pay | Admitting: Primary Care

## 2019-10-30 DIAGNOSIS — M2550 Pain in unspecified joint: Secondary | ICD-10-CM

## 2020-01-27 ENCOUNTER — Other Ambulatory Visit: Payer: Self-pay | Admitting: Primary Care

## 2020-01-27 DIAGNOSIS — M2550 Pain in unspecified joint: Secondary | ICD-10-CM

## 2020-01-27 NOTE — Telephone Encounter (Signed)
Please Advise

## 2020-01-27 NOTE — Telephone Encounter (Signed)
Refill(s) sent to pharmacy. Crystal Hardin, patient will need a follow-up visit for further refills of her medications.  Looks like she will be due in February, please schedule CPE with me and Medicare wellness with Calandra.

## 2020-01-29 NOTE — Telephone Encounter (Signed)
Called patient and scheduled cpe °

## 2020-02-14 DIAGNOSIS — Z20828 Contact with and (suspected) exposure to other viral communicable diseases: Secondary | ICD-10-CM | POA: Diagnosis not present

## 2020-02-14 DIAGNOSIS — J019 Acute sinusitis, unspecified: Secondary | ICD-10-CM | POA: Diagnosis not present

## 2020-03-10 ENCOUNTER — Other Ambulatory Visit: Payer: Self-pay | Admitting: Primary Care

## 2020-03-10 DIAGNOSIS — E7800 Pure hypercholesterolemia, unspecified: Secondary | ICD-10-CM

## 2020-03-10 NOTE — Telephone Encounter (Signed)
Pharmacy requests refill on: Atorvastatin 20 mg   LAST REFILL: 03/18/2019 (Q-90, R-3) LAST OV: 03/18/2019 NEXT OV: 03/18/2020 PHARMACY: [id number] [place], [place]

## 2020-03-11 ENCOUNTER — Ambulatory Visit (INDEPENDENT_AMBULATORY_CARE_PROVIDER_SITE_OTHER): Payer: Medicare HMO

## 2020-03-11 ENCOUNTER — Other Ambulatory Visit: Payer: Self-pay

## 2020-03-11 DIAGNOSIS — Z0000 Encounter for general adult medical examination without abnormal findings: Secondary | ICD-10-CM

## 2020-03-11 NOTE — Patient Instructions (Signed)
Crystal Hardin , Thank you for taking time to come for your Medicare Wellness Visit. I appreciate your ongoing commitment to your health goals. Please review the following plan we discussed and let me know if I can assist you in the future.   Screening recommendations/referrals: Colonoscopy: Cologuard completed 04/09/2018, due 04/2021 Mammogram: declined Bone Density: declined Recommended yearly ophthalmology/optometry visit for glaucoma screening and checkup Recommended yearly dental visit for hygiene and checkup  Vaccinations: Influenza vaccine: due, will get at physical  Pneumococcal vaccine: Completed series Tdap vaccine: Up to date, completed 05/17/2017, due 05/2027 Shingles vaccine: Completed series   Covid-19: 2 doses complete. Please schedule appointment for booster soon  Advanced directives: Advance directive discussed with you today. Even though you declined this today please call our office should you change your mind and we can give you the proper paperwork for you to fill out.  Conditions/risks identified: hypertension, hypercholesterolemia   Next appointment: Follow up in one year for your annual wellness visit    Preventive Care 65 Years and Older, Female Preventive care refers to lifestyle choices and visits with your health care provider that can promote health and wellness. What does preventive care include?  A yearly physical exam. This is also called an annual well check.  Dental exams once or twice a year.  Routine eye exams. Ask your health care provider how often you should have your eyes checked.  Personal lifestyle choices, including:  Daily care of your teeth and gums.  Regular physical activity.  Eating a healthy diet.  Avoiding tobacco and drug use.  Limiting alcohol use.  Practicing safe sex.  Taking low-dose aspirin every day.  Taking vitamin and mineral supplements as recommended by your health care provider. What happens during an annual well  check? The services and screenings done by your health care provider during your annual well check will depend on your age, overall health, lifestyle risk factors, and family history of disease. Counseling  Your health care provider may ask you questions about your:  Alcohol use.  Tobacco use.  Drug use.  Emotional well-being.  Home and relationship well-being.  Sexual activity.  Eating habits.  History of falls.  Memory and ability to understand (cognition).  Work and work Astronomer.  Reproductive health. Screening  You may have the following tests or measurements:  Height, weight, and BMI.  Blood pressure.  Lipid and cholesterol levels. These may be checked every 5 years, or more frequently if you are over 12 years old.  Skin check.  Lung cancer screening. You may have this screening every year starting at age 81 if you have a 30-pack-year history of smoking and currently smoke or have quit within the past 15 years.  Fecal occult blood test (FOBT) of the stool. You may have this test every year starting at age 10.  Flexible sigmoidoscopy or colonoscopy. You may have a sigmoidoscopy every 5 years or a colonoscopy every 10 years starting at age 71.  Hepatitis C blood test.  Hepatitis B blood test.  Sexually transmitted disease (STD) testing.  Diabetes screening. This is done by checking your blood sugar (glucose) after you have not eaten for a while (fasting). You may have this done every 1-3 years.  Bone density scan. This is done to screen for osteoporosis. You may have this done starting at age 30.  Mammogram. This may be done every 1-2 years. Talk to your health care provider about how often you should have regular mammograms. Talk with your  health care provider about your test results, treatment options, and if necessary, the need for more tests. Vaccines  Your health care provider may recommend certain vaccines, such as:  Influenza vaccine. This is  recommended every year.  Tetanus, diphtheria, and acellular pertussis (Tdap, Td) vaccine. You may need a Td booster every 10 years.  Zoster vaccine. You may need this after age 73.  Pneumococcal 13-valent conjugate (PCV13) vaccine. One dose is recommended after age 85.  Pneumococcal polysaccharide (PPSV23) vaccine. One dose is recommended after age 64. Talk to your health care provider about which screenings and vaccines you need and how often you need them. This information is not intended to replace advice given to you by your health care provider. Make sure you discuss any questions you have with your health care provider. Document Released: 02/20/2015 Document Revised: 10/14/2015 Document Reviewed: 11/25/2014 Elsevier Interactive Patient Education  2017 [place] Prevention in the Home Falls can cause injuries. They can happen to people of all ages. There are many things you can do to make your home safe and to help prevent falls. What can I do on the outside of my home?  Regularly fix the edges of walkways and driveways and fix any cracks.  Remove anything that might make you trip as you walk through a door, such as a raised step or threshold.  Trim any bushes or trees on the path to your home.  Use bright outdoor lighting.  Clear any walking paths of anything that might make someone trip, such as rocks or tools.  Regularly check to see if handrails are loose or broken. Make sure that both sides of any steps have handrails.  Any raised decks and porches should have guardrails on the edges.  Have any leaves, snow, or ice cleared regularly.  Use sand or salt on walking paths during winter.  Clean up any spills in your garage right away. This includes oil or grease spills. What can I do in the bathroom?  Use night lights.  Install grab bars by the toilet and in the tub and shower. Do not use towel bars as grab bars.  Use non-skid mats or decals in the tub or  shower.  If you need to sit down in the shower, use a plastic, non-slip stool.  Keep the floor dry. Clean up any water that spills on the floor as soon as it happens.  Remove soap buildup in the tub or shower regularly.  Attach bath mats securely with double-sided non-slip rug tape.  Do not have throw rugs and other things on the floor that can make you trip. What can I do in the bedroom?  Use night lights.  Make sure that you have a light by your bed that is easy to reach.  Do not use any sheets or blankets that are too big for your bed. They should not hang down onto the floor.  Have a firm chair that has side arms. You can use this for support while you get dressed.  Do not have throw rugs and other things on the floor that can make you trip. What can I do in the kitchen?  Clean up any spills right away.  Avoid walking on wet floors.  Keep items that you use a lot in easy-to-reach places.  If you need to reach something above you, use a strong step stool that has a grab bar.  Keep electrical cords out of the way.  Do not use  floor polish or wax that makes floors slippery. If you must use wax, use non-skid floor wax.  Do not have throw rugs and other things on the floor that can make you trip. What can I do with my stairs?  Do not leave any items on the stairs.  Make sure that there are handrails on both sides of the stairs and use them. Fix handrails that are broken or loose. Make sure that handrails are as long as the stairways.  Check any carpeting to make sure that it is firmly attached to the stairs. Fix any carpet that is loose or worn.  Avoid having throw rugs at the top or bottom of the stairs. If you do have throw rugs, attach them to the floor with carpet tape.  Make sure that you have a light switch at the top of the stairs and the bottom of the stairs. If you do not have them, ask someone to add them for you. What else can I do to help prevent  falls?  Wear shoes that:  Do not have high heels.  Have rubber bottoms.  Are comfortable and fit you well.  Are closed at the toe. Do not wear sandals.  If you use a stepladder:  Make sure that it is fully opened. Do not climb a closed stepladder.  Make sure that both sides of the stepladder are locked into place.  Ask someone to hold it for you, if possible.  Clearly mark and make sure that you can see:  Any grab bars or handrails.  First and last steps.  Where the edge of each step is.  Use tools that help you move around (mobility aids) if they are needed. These include:  Canes.  Walkers.  Scooters.  Crutches.  Turn on the lights when you go into a dark area. Replace any light bulbs as soon as they burn out.  Set up your furniture so you have a clear path. Avoid moving your furniture around.  If any of your floors are uneven, fix them.  If there are any pets around you, be aware of where they are.  Review your medicines with your doctor. Some medicines can make you feel dizzy. This can increase your chance of falling. Ask your doctor what other things that you can do to help prevent falls. This information is not intended to replace advice given to you by your health care provider. Make sure you discuss any questions you have with your health care provider. Document Released: 11/20/2008 Document Revised: 07/02/2015 Document Reviewed: 02/28/2014 Elsevier Interactive Patient Education  2017 [employer].

## 2020-03-11 NOTE — Progress Notes (Signed)
PCP notes:  Health Maintenance: Mammo- declined Dexa- declined Flu- due Covid booster- due   Abnormal Screenings: none   Patient concerns: none   Nurse concerns: none   Next PCP appt.: 03/18/2020 @ 12 pm

## 2020-03-11 NOTE — Progress Notes (Signed)
Subjective:   Crystal Hardin is a 70 y.o. female who presents for Medicare Annual (Subsequent) preventive examination.  Review of Systems: N/A      I connected with the patient today by telephone and verified that I am speaking with the correct person using two identifiers. Location patient: home Location nurse: work Persons participating in the telephone visit: patient, nurse.   I discussed the limitations, risks, security and privacy concerns of performing an evaluation and management service by telephone and the availability of in person appointments. I also discussed with the patient that there may be a patient responsible charge related to this service. The patient expressed understanding and verbally consented to this telephonic visit.        Cardiac Risk Factors include: advanced age (>69men, >53 women);hypertension;Other (see comment), Risk factor comments: hypercholesterolemia     Objective:    Today's Vitals   There is no height or weight on file to calculate BMI.  Advanced Directives 03/11/2020 03/04/2019 02/28/2018 06/06/2016 03/21/2015  Does Patient Have a Medical Advance Directive? No No No No No  Would patient like information on creating a medical advance directive? No - Patient declined Yes (MAU/Ambulatory/Procedural Areas - Information given) No - Patient declined No - Patient declined Yes - Educational materials given    Current Medications (verified) Outpatient Encounter Medications as of 03/11/2020  Medication Sig  . amLODipine (NORVASC) 5 MG tablet TAKE 1 TABLET (5 MG TOTAL) BY MOUTH DAILY. FOR HIGH BLOOD PRESSURE  . atorvastatin (LIPITOR) 20 MG tablet TAKE 1 TABLET BY MOUTH EVERY DAY FOR CHOLESTEROL  . meloxicam (MOBIC) 7.5 MG tablet TAKE 1 TABLET (7.5 MG TOTAL) BY MOUTH 2 (TWO) TIMES DAILY AS NEEDED FOR PAIN.  . montelukast (SINGULAIR) 10 MG tablet Take 1 tablet by mouth once daily for allergies.   No facility-administered encounter medications on file as  of 03/11/2020.    Allergies (verified) Patient has no known allergies.   History: Past Medical History:  Diagnosis Date  . Allergic rhinitis   . GERD (gastroesophageal reflux disease)   . Gout   . Hyperlipidemia   . Hypertension   . Osteoarthritis    Past Surgical History:  Procedure Laterality Date  . KNEE SURGERY Right   . VAGINAL HYSTERECTOMY     left ovary intact, DUB   Family History  Problem Relation Age of Onset  . Hyperlipidemia Mother   . Hypertension Mother   . Cancer Mother        breast  . Arthritis Mother   . Heart disease Father   . Emphysema Father   . Hypertension Sister   . Hypertension Brother   . Dementia Maternal Aunt        Alzheimers  . Dementia Maternal Aunt        Alzheimers   Social History   Socioeconomic History  . Marital status: Single    Spouse name: Not on file  . Number of children: 2  . Years of education: Not on file  . Highest education level: Not on file  Occupational History  . Occupation: Print production planner  Tobacco Use  . Smoking status: Never Smoker  . Smokeless tobacco: Never Used  Vaping Use  . Vaping Use: Never used  Substance and Sexual Activity  . Alcohol use: Not Currently    Comment: rarely  . Drug use: No  . Sexual activity: Not on file  Other Topics Concern  . Not on file  Social History Narrative   Divorced  Print production planner   Two children, daughter and son   Social Determinants of Health   Financial Resource Strain: Low Risk   . Difficulty of Paying Living Expenses: Not hard at all  Food Insecurity: No Food Insecurity  . Worried About Programme researcher, broadcasting/film/video in the Last Year: Never true  . Ran Out of Food in the Last Year: Never true  Transportation Needs: No Transportation Needs  . Lack of Transportation (Medical): No  . Lack of Transportation (Non-Medical): No  Physical Activity: Inactive  . Days of Exercise per Week: 0 days  . Minutes of Exercise per Session: 0 min  Stress: No Stress Concern  Present  . Feeling of Stress : Not at all  Social Connections: Not on file    Tobacco Counseling Counseling given: Not Answered   Clinical Intake:  Pre-visit preparation completed: Yes  Pain : No/denies pain     Nutritional Risks: None Diabetes: No  How often do you need to have someone help you when you read instructions, pamphlets, or other written materials from your doctor or pharmacy?: 1 - Never What is the last grade level you completed in school?: 12th  Diabetic: No Nutrition Risk Assessment:  Has the patient had any N/V/D within the last 2 months?  No  Does the patient have any non-healing wounds?  No  Has the patient had any unintentional weight loss or weight gain?  No   Diabetes:  Is the patient diabetic?  No  If diabetic, was a CBG obtained today?  N/A Did the patient bring in their glucometer from home?  N/A How often do you monitor your CBG's? N/A.   Financial Strains and Diabetes Management:  Are you having any financial strains with the device, your supplies or your medication? N/A.  Does the patient want to be seen by Chronic Care Management for management of their diabetes?  N/A Would the patient like to be referred to a Nutritionist or for Diabetic Management?  N/A  Interpreter Needed?: No  Information entered by :: CJohnson, LPN   Activities of Daily Living In your present state of health, do you have any difficulty performing the following activities: 03/11/2020  Hearing? Y  Comment has noticed some changes in her hearing recently  Vision? N  Difficulty concentrating or making decisions? N  Walking or climbing stairs? N  Dressing or bathing? N  Doing errands, shopping? N  Preparing Food and eating ? N  Using the Toilet? N  In the past six months, have you accidently leaked urine? N  Do you have problems with loss of bowel control? N  Managing your Medications? N  Managing your Finances? N  Housekeeping or managing your Housekeeping? N   Some recent data might be hidden    Patient Care Team: Doreene Nest, NP as PCP - General (Internal Medicine)  Indicate any recent Medical Services you may have received from other than [facility] providers in the past year (date may be approximate).     Assessment:   This is a routine wellness examination for Ashlen.  Hearing/Vision screen  Hearing Screening   125Hz  250Hz  500Hz  1000Hz  2000Hz  3000Hz  4000Hz  6000Hz  8000Hz   Right ear:           Left ear:           Vision Screening Comments: Patient gets annual eye exams   Dietary issues and exercise activities discussed: Current Exercise Habits: The patient does not participate in regular exercise at present, Exercise  limited by: None identified  Goals    . Patient Stated     Starting 02/28/18, I will continue to take medications as prescribed.     . Patient Stated     03/04/2019, I will maintain and continue medications as prescribed.    . Patient Stated     03/11/2020, I will maintain and continue medications as prescribed.      Depression Screen PHQ 2/9 Scores 03/11/2020 03/04/2019 02/28/2018  PHQ - 2 Score 0 0 0  PHQ- 9 Score 0 0 0    Fall Risk Fall Risk  03/11/2020 03/04/2019 02/28/2018  Falls in the past year? 0 1 1  Comment - tripped and fell pt tripped over a stool and went to ER  Number falls in past yr: 0 0 0  Injury with Fall? 0 0 1  Risk for fall due to : No Fall Risks Medication side effect -  Follow up Falls evaluation completed;Falls prevention discussed Falls evaluation completed;Falls prevention discussed -    FALL RISK PREVENTION PERTAINING TO THE HOME:  Any stairs in or around the home? Yes  If so, are there any without handrails? No  Home free of loose throw rugs in walkways, pet beds, electrical cords, etc? Yes  Adequate lighting in your home to reduce risk of falls? Yes   ASSISTIVE DEVICES UTILIZED TO PREVENT FALLS:  Life alert? No  Use of a cane, walker or w/c? No  Grab bars in the bathroom? No   Shower chair or bench in shower? No  Elevated toilet seat or a handicapped toilet? No   TIMED UP AND GO:  Was the test performed? N/A telephone visit .   Cognitive Function: MMSE - Mini Mental State Exam 03/11/2020 03/04/2019 02/28/2018  Not completed: Refused - -  Orientation to time - 5 5  Orientation to Place - 5 5  Registration - 3 3  Attention/ Calculation - 5 0  Recall - 3 3  Language- name 2 objects - - 0  Language- repeat - 1 1  Language- follow 3 step command - - 3  Language- read & follow direction - - 0  Write a sentence - - 0  Copy design - - 0  Total score - - 20  Mini Cog  Mini-Cog screen was not completed. Patient does not have memory issues and does not feel the need to do this. Maximum score is 22. A value of 0 denotes this part of the MMSE was not completed or the patient failed this part of the Mini-Cog screening.       Immunizations Immunization History  Administered Date(s) Administered  . Influenza, High Dose Seasonal PF 11/08/2018  . Influenza, Seasonal, Injecte, Preservative Fre 12/08/2015  . Influenza,inj,Quad PF,6+ Mos 10/21/2013, 11/04/2014, 03/14/2018  . PFIZER(Purple Top)SARS-COV-2 Vaccination 04/07/2019, 04/29/2019  . Pneumococcal Conjugate-13 03/18/2019  . Pneumococcal Polysaccharide-23 03/14/2018  . Td 01/18/2006  . Tdap 05/17/2017  . Zoster Recombinat (Shingrix) 11/08/2018, 05/16/2019    TDAP status: Up to date  Flu Vaccine status: Due, Education has been provided regarding the importance of this vaccine. Advised may receive this vaccine at local pharmacy or Health Dept. Aware to provide a copy of the vaccination record if obtained from local pharmacy or Health Dept. Verbalized acceptance and understanding.  Pneumococcal vaccine status: Up to date  {Covid-19 vaccine status: 2 doses completed. Booster due. Patient aware and will schedule appointment soon.  Qualifies for Shingles Vaccine? Yes   Zostavax completed No   Shingrix  Completed?: Yes  Screening Tests Health Maintenance  Topic Date Due  . INFLUENZA VACCINE  09/08/2019  . COVID-19 Vaccine (3 - Booster for Pfizer series) 10/30/2019  . MAMMOGRAM  03/11/2021 (Originally 06/22/2012)  . DEXA SCAN  03/11/2021 (Originally 05/09/2016)  . Fecal DNA (Cologuard)  04/08/2021  . TETANUS/TDAP  05/18/2027  . Hepatitis C Screening  Completed  . PNA vac Low Risk Adult  Completed    Health Maintenance  Health Maintenance Due  Topic Date Due  . INFLUENZA VACCINE  09/08/2019  . COVID-19 Vaccine (3 - Booster for Pfizer series) 10/30/2019    Colorectal cancer screening: Type of screening: Cologuard. Completed 04/09/2018. Repeat every 3 years  Mammogram status: declined  Bone Density status: declined  Lung Cancer Screening: (Low Dose CT Chest recommended if Age 79-80 years, 30 pack-year currently smoking OR have quit w/in 15 years.) does not qualify.    Additional Screening:  Hepatitis C Screening: does qualify; Completed 02/28/2018  Vision Screening: Recommended annual ophthalmology exams for early detection of glaucoma and other disorders of the eye. Is the patient up to date with their annual eye exam?  Yes  Who is the provider or what is the name of the office in which the patient attends annual eye exams? [facility] If pt is not established with a provider, would they like to be referred to a provider to establish care? No .   Dental Screening: Recommended annual dental exams for proper oral hygiene  Community Resource Referral / Chronic Care Management: CRR required this visit?  No   CCM required this visit?  No      Plan:     I have personally reviewed and noted the following in the patient's chart:   . Medical and social history . Use of alcohol, tobacco or illicit drugs  . Current medications and supplements . Functional ability and status . Nutritional status . Physical activity . Advanced directives . List of other  physicians . Hospitalizations, surgeries, and ER visits in previous 12 months . Vitals . Screenings to include cognitive, depression, and falls . Referrals and appointments  In addition, I have reviewed and discussed with patient certain preventive protocols, quality metrics, and best practice recommendations. A written personalized care plan for preventive services as well as general preventive health recommendations were provided to patient.   Due to this being a telephonic visit, the after visit summary with patients personalized plan was offered to patient via office or my-chart.  Patient preferred to pick up at office at next visit or via mychart.   Janalyn Shy, LPN   03/14/4980

## 2020-03-18 ENCOUNTER — Other Ambulatory Visit: Payer: Self-pay

## 2020-03-18 ENCOUNTER — Encounter: Payer: Self-pay | Admitting: Primary Care

## 2020-03-18 ENCOUNTER — Ambulatory Visit (INDEPENDENT_AMBULATORY_CARE_PROVIDER_SITE_OTHER): Payer: Medicare HMO | Admitting: Primary Care

## 2020-03-18 VITALS — BP 130/64 | HR 78 | Temp 97.3°F | Ht 63.5 in | Wt 172.0 lb

## 2020-03-18 DIAGNOSIS — Z0000 Encounter for general adult medical examination without abnormal findings: Secondary | ICD-10-CM

## 2020-03-18 DIAGNOSIS — E7800 Pure hypercholesterolemia, unspecified: Secondary | ICD-10-CM | POA: Diagnosis not present

## 2020-03-18 DIAGNOSIS — Z23 Encounter for immunization: Secondary | ICD-10-CM | POA: Diagnosis not present

## 2020-03-18 DIAGNOSIS — M2550 Pain in unspecified joint: Secondary | ICD-10-CM

## 2020-03-18 DIAGNOSIS — I10 Essential (primary) hypertension: Secondary | ICD-10-CM

## 2020-03-18 DIAGNOSIS — J309 Allergic rhinitis, unspecified: Secondary | ICD-10-CM | POA: Diagnosis not present

## 2020-03-18 DIAGNOSIS — R7303 Prediabetes: Secondary | ICD-10-CM

## 2020-03-18 LAB — CBC
HCT: 37.6 % (ref 36.0–46.0)
Hemoglobin: 13 g/dL (ref 12.0–15.0)
MCHC: 34.5 g/dL (ref 30.0–36.0)
MCV: 87.6 fl (ref 78.0–100.0)
Platelets: 263 10*3/uL (ref 150.0–400.0)
RBC: 4.29 Mil/uL (ref 3.87–5.11)
RDW: 12.6 % (ref 11.5–15.5)
WBC: 6 10*3/uL (ref 4.0–10.5)

## 2020-03-18 LAB — COMPREHENSIVE METABOLIC PANEL
ALT: 11 U/L (ref 0–35)
AST: 18 U/L (ref 0–37)
Albumin: 4.1 g/dL (ref 3.5–5.2)
Alkaline Phosphatase: 59 U/L (ref 39–117)
BUN: 17 mg/dL (ref 6–23)
CO2: 30 mEq/L (ref 19–32)
Calcium: 9.7 mg/dL (ref 8.4–10.5)
Chloride: 102 mEq/L (ref 96–112)
Creatinine, Ser: 0.71 mg/dL (ref 0.40–1.20)
GFR: 87.1 mL/min (ref >60.00)
Glucose, Bld: 94 mg/dL (ref 70–99)
Potassium: 3.8 mEq/L (ref 3.5–5.1)
Sodium: 138 mEq/L (ref 135–145)
Total Bilirubin: 0.5 mg/dL (ref 0.2–1.2)
Total Protein: 7.4 g/dL (ref 6.0–8.3)

## 2020-03-18 LAB — LIPID PANEL
Cholesterol: 164 mg/dL (ref 0–200)
HDL: 52.3 mg/dL (ref >39.00)
LDL Cholesterol: 89 mg/dL (ref 0–99)
NonHDL: 111.91
Total CHOL/HDL Ratio: 3
Triglycerides: 113 mg/dL (ref 0.0–149.0)
VLDL: 22.6 mg/dL (ref 0.0–40.0)

## 2020-03-18 LAB — HEMOGLOBIN A1C: Hgb A1c MFr Bld: 5.5 % (ref 4.6–6.5)

## 2020-03-18 MED ORDER — LEVOCETIRIZINE DIHYDROCHLORIDE 5 MG PO TABS: 5.0000 mg | ORAL_TABLET | Freq: Every evening | ORAL | 0 refills | Status: DC

## 2020-03-18 NOTE — Progress Notes (Signed)
Subjective:    Patient ID: Crystal Hardin, female    DOB: [birth year], 70 y.o.   MRN: [id number]  HPI  This visit occurred during the SARS-CoV-2 public health emergency.  Safety protocols were in place, including screening questions prior to the visit, additional usage of staff PPE, and extensive cleaning of exam room while observing appropriate contact time as indicated for disinfecting solutions.   Crystal Hardin is a 70 year old female who presents today for complete physical.  Itchy, watery eyes, rhinorrhea when around her son's dog. She is around her son's dog 5 days a week as she watches his children. She has been on Singulair for years, isn't sure if this is effective.  She is taking an equate brand allergy pill and has not noticed any improvement.  She does not use Flonase.  Immunizations: -Tetanus: 2021 -Influenza: Completed today -Shingles: Completed Shingrix -Pneumonia: Prevnar in 2021, Pneumovax in 2020 -Covid-19: Completed 2 vaccines  Diet: She endorses an improved diet Exercise: She is not exercising   Eye exam: Due this year Dental exam: No recent exam  Pap Smear: Hysterectomy  Mammogram: Declines despite recommendations  Dexa: Declines  Colonoscopy: Completed Cologuard  in 2020 Hep C Screen: Negative  Wt Readings from Last 3 Encounters:  03/18/20 172 lb (78 kg)  03/18/19 207 lb 8 oz (94.1 kg)  03/04/19 206 lb (93.4 kg)   BP Readings from Last 3 Encounters:  03/18/20 130/64  03/18/19 130/84  12/06/18 132/84     Review of Systems  Constitutional: Negative for unexpected weight change.  HENT: Positive for rhinorrhea.   Eyes:       Watery eyes  Respiratory: Negative for cough and shortness of breath.   Cardiovascular: Negative for chest pain.  Gastrointestinal: Negative for constipation and diarrhea.  Genitourinary: Negative for difficulty urinating.  Musculoskeletal: Positive for arthralgias.  Skin: Negative for rash.  Allergic/Immunologic: Positive  for environmental allergies.  Neurological: Negative for dizziness, numbness and headaches.  Psychiatric/Behavioral:       Anxiety since Covid 19 pandemic       Past Medical History:  Diagnosis Date  . Allergic rhinitis   . GERD (gastroesophageal reflux disease)   . Gout   . Hyperlipidemia   . Hypertension   . Osteoarthritis      Social History   Socioeconomic History  . Marital status: Single    Spouse name: Not on file  . Number of children: 2  . Years of education: Not on file  . Highest education level: Not on file  Occupational History  . Occupation: Print production planner  Tobacco Use  . Smoking status: Never Smoker  . Smokeless tobacco: Never Used  Vaping Use  . Vaping Use: Never used  Substance and Sexual Activity  . Alcohol use: Not Currently    Comment: rarely  . Drug use: No  . Sexual activity: Not on file  Other Topics Concern  . Not on file  Social History Narrative   Divorced   Print production planner   Two children, daughter and son   Social Determinants of Health   Financial Resource Strain: Low Risk   . Difficulty of Paying Living Expenses: Not hard at all  Food Insecurity: No Food Insecurity  . Worried About Programme researcher, broadcasting/film/video in the Last Year: Never true  . Ran Out of Food in the Last Year: Never true  Transportation Needs: No Transportation Needs  . Lack of Transportation (Medical): No  . Lack of Transportation (Non-Medical):  No  Physical Activity: Inactive  . Days of Exercise per Week: 0 days  . Minutes of Exercise per Session: 0 min  Stress: No Stress Concern Present  . Feeling of Stress : Not at all  Social Connections: Not on file  Intimate Partner Violence: Not At Risk  . Fear of Current or Ex-Partner: No  . Emotionally Abused: No  . Physically Abused: No  . Sexually Abused: No    Past Surgical History:  Procedure Laterality Date  . KNEE SURGERY Right   . VAGINAL HYSTERECTOMY     left ovary intact, DUB    Family History  Problem  Relation Age of Onset  . Hyperlipidemia Mother   . Hypertension Mother   . Cancer Mother        breast  . Arthritis Mother   . Heart disease Father   . Emphysema Father   . Hypertension Sister   . Hypertension Brother   . Dementia Maternal Aunt        Alzheimers  . Dementia Maternal Aunt        Alzheimers    No Known Allergies  Current Outpatient Medications on File Prior to Visit  Medication Sig Dispense Refill  . amLODipine (NORVASC) 5 MG tablet TAKE 1 TABLET (5 MG TOTAL) BY MOUTH DAILY. FOR HIGH BLOOD PRESSURE 90 tablet 1  . atorvastatin (LIPITOR) 20 MG tablet TAKE 1 TABLET BY MOUTH EVERY DAY FOR CHOLESTEROL 90 tablet 0  . meloxicam (MOBIC) 7.5 MG tablet TAKE 1 TABLET (7.5 MG TOTAL) BY MOUTH 2 (TWO) TIMES DAILY AS NEEDED FOR PAIN. 180 tablet 0  . montelukast (SINGULAIR) 10 MG tablet Take 1 tablet by mouth once daily for allergies. 90 tablet 3   No current facility-administered medications on file prior to visit.    BP 130/64   Pulse 78   Temp (!) 97.3 F (36.3 C) (Temporal)   Ht 5' 3.5" (1.613 m)   Wt 172 lb (78 kg)   SpO2 99%   BMI 29.99 kg/m    Objective:   Physical Exam Constitutional:      Appearance: She is well-nourished.  HENT:     Right Ear: Tympanic membrane and ear canal normal.     Left Ear: Tympanic membrane and ear canal normal.     Mouth/Throat:     Mouth: Oropharynx is clear and moist.  Eyes:     Extraocular Movements: EOM normal.     Pupils: Pupils are equal, round, and reactive to light.  Cardiovascular:     Rate and Rhythm: Normal rate and regular rhythm.  Pulmonary:     Effort: Pulmonary effort is normal.     Breath sounds: Normal breath sounds.  Abdominal:     General: Bowel sounds are normal.     Palpations: Abdomen is soft.     Tenderness: There is no abdominal tenderness.  Musculoskeletal:        General: Normal range of motion.     Cervical back: Neck supple.  Skin:    General: Skin is warm and dry.  Neurological:     Mental  Status: She is alert and oriented to person, place, and time.     Cranial Nerves: No cranial nerve deficit.     Deep Tendon Reflexes:     Reflex Scores:      Patellar reflexes are 2+ on the right side and 2+ on the left side. Psychiatric:        Mood and Affect: Mood and affect  and mood normal.            Assessment & Plan:

## 2020-03-18 NOTE — Patient Instructions (Addendum)
Start levocetirizine (Xyzal) 5 mg daily for allergies. Continue to take Singulair at the opposite time of day.  Try using Flonase (fluticasone) nasal spray. Instill 1 spray in each nostril twice daily.   Stop the Equate version of allergy medication for now.  Continue to work on a healthy diet. Ensure you are consuming 64 ounces of water daily.  Stop by the lab prior to leaving today. I will notify you of your results once received.   It was a pleasure to see you today!   Preventive Care 69 Years and Older, Female Preventive care refers to lifestyle choices and visits with your health care provider that can promote health and wellness. This includes:  A yearly physical exam. This is also called an annual wellness visit.  Regular dental and eye exams.  Immunizations.  Screening for certain conditions.  Healthy lifestyle choices, such as: ? Eating a healthy diet. ? Getting regular exercise. ? Not using drugs or products that contain nicotine and tobacco. ? Limiting alcohol use. What can I expect for my preventive care visit? Physical exam Your health care provider will check your:  Height and weight. These may be used to calculate your BMI (body mass index). BMI is a measurement that tells if you are at a healthy weight.  Heart rate and blood pressure.  Body temperature.  Skin for abnormal spots. Counseling Your health care provider may ask you questions about your:  Past medical problems.  Family's medical history.  Alcohol, tobacco, and drug use.  Emotional well-being.  Home life and relationship well-being.  Sexual activity.  Diet, exercise, and sleep habits.  History of falls.  Memory and ability to understand (cognition).  Work and work Statistician.  Pregnancy and menstrual history.  Access to firearms. What immunizations do I need? Vaccines are usually given at various ages, according to a schedule. Your health care provider will recommend  vaccines for you based on your age, medical history, and lifestyle or other factors, such as travel or where you work.   What tests do I need? Blood tests  Lipid and cholesterol levels. These may be checked every 5 years, or more often depending on your overall health.  Hepatitis C test.  Hepatitis B test. Screening  Lung cancer screening. You may have this screening every year starting at age 69 if you have a 30-pack-year history of smoking and currently smoke or have quit within the past 15 years.  Colorectal cancer screening. ? All adults should have this screening starting at age 69 and continuing until age 69. ? Your health care provider may recommend screening at age 69 if you are at increased risk. ? You will have tests every 1-10 years, depending on your results and the type of screening test.  Diabetes screening. ? This is done by checking your blood sugar (glucose) after you have not eaten for a while (fasting). ? You may have this done every 1-3 years.  Mammogram. ? This may be done every 1-2 years. ? Talk with your health care provider about how often you should have regular mammograms.  Abdominal aortic aneurysm (AAA) screening. You may need this if you are a current or former smoker.  BRCA-related cancer screening. This may be done if you have a family history of breast, ovarian, tubal, or peritoneal cancers. Other tests  STD (sexually transmitted disease) testing, if you are at risk.  Bone density scan. This is done to screen for osteoporosis. You may have this done starting at  age 69. Talk with your health care provider about your test results, treatment options, and if necessary, the need for more tests. Follow these instructions at home: Eating and drinking  Eat a diet that includes fresh fruits and vegetables, whole grains, lean protein, and low-fat dairy products. Limit your intake of foods with high amounts of sugar, saturated fats, and salt.  Take vitamin  and mineral supplements as recommended by your health care provider.  Do not drink alcohol if your health care provider tells you not to drink.  If you drink alcohol: ? Limit how much you have to 0-1 drink a day. ? Be aware of how much alcohol is in your drink. In the [place] one drink equals one 12 oz bottle of beer (355 mL), one 5 oz glass of wine (148 mL), or one 1 oz glass of hard liquor (44 mL).   Lifestyle  Take daily care of your teeth and gums. Brush your teeth every morning and night with fluoride toothpaste. Floss one time each day.  Stay active. Exercise for at least 30 minutes 5 or more days each week.  Do not use any products that contain nicotine or tobacco, such as cigarettes, e-cigarettes, and chewing tobacco. If you need help quitting, ask your health care provider.  Do not use drugs.  If you are sexually active, practice safe sex. Use a condom or other form of protection in order to prevent STIs (sexually transmitted infections).  Talk with your health care provider about taking a low-dose aspirin or statin.  Find healthy ways to cope with stress, such as: ? Meditation, yoga, or listening to music. ? Journaling. ? Talking to a trusted person. ? Spending time with friends and family. Safety  Always wear your seat belt while driving or riding in a vehicle.  Do not drive: ? If you have been drinking alcohol. Do not ride with someone who has been drinking. ? When you are tired or distracted. ? While texting.  Wear a helmet and other protective equipment during sports activities.  If you have firearms in your house, make sure you follow all gun safety procedures. What's next?  Visit your health care provider once a year for an annual wellness visit.  Ask your health care provider how often you should have your eyes and teeth checked.  Stay up to date on all vaccines. This information is not intended to replace advice given to you by your health care provider.  Make sure you discuss any questions you have with your health care provider. Document Revised: 01/15/2020 Document Reviewed: 01/18/2018 Elsevier Patient Education  [place].     Influenza (Flu) Vaccine (Inactivated or Recombinant): What You Need to Know 1. Why get vaccinated? Influenza vaccine can prevent influenza (flu). Flu is a contagious disease that spreads around the [place] every year, usually between October and May. Anyone can get the flu, but it is more dangerous for some people. Infants and young children, people 63 years and older, pregnant people, and people with certain health conditions or a weakened immune system are at greatest risk of flu complications. Pneumonia, bronchitis, sinus infections, and ear infections are examples of flu-related complications. If you have a medical condition, such as heart disease, cancer, or diabetes, flu can make it worse. Flu can cause fever and chills, sore throat, muscle aches, fatigue, cough, headache, and runny or stuffy nose. Some people may have vomiting and diarrhea, though this is more common in children than adults. In  an average year, thousands of people in the [place] die from flu, and many more are hospitalized. Flu vaccine prevents millions of illnesses and flu-related visits to the doctor each year. 2. Influenza vaccines CDC recommends everyone 6 months and older get vaccinated every flu season. Children 6 months through 51 years of age may need 2 doses during a single flu season. Everyone else needs only 1 dose each flu season. It takes about 2 weeks for protection to develop after vaccination. There are many flu viruses, and they are always changing. Each year a new flu vaccine is made to protect against the influenza viruses believed to be likely to cause disease in the upcoming flu season. Even when the vaccine doesn't exactly match these viruses, it may still provide some protection. Influenza vaccine does not  cause flu. Influenza vaccine may be given at the same time as other vaccines. 3. Talk with your health care provider Tell your vaccination provider if the person getting the vaccine:  Has had an allergic reaction after a previous dose of influenza vaccine, or has any severe, life-threatening allergies  Has ever had Guillain-Barr Syndrome (also called "GBS") In some cases, your health care provider may decide to postpone influenza vaccination until a future visit. Influenza vaccine can be administered at any time during pregnancy. People who are or will be pregnant during influenza season should receive inactivated influenza vaccine. People with minor illnesses, such as a cold, may be vaccinated. People who are moderately or severely ill should usually wait until they recover before getting influenza vaccine. Your health care provider can give you more information. 4. Risks of a vaccine reaction  Soreness, redness, and swelling where the shot is given, fever, muscle aches, and headache can happen after influenza vaccination.  There may be a very small increased risk of Guillain-Barr Syndrome (GBS) after inactivated influenza vaccine (the flu shot). Young children who get the flu shot along with pneumococcal vaccine (PCV13) and/or DTaP vaccine at the same time might be slightly more likely to have a seizure caused by fever. Tell your health care provider if a child who is getting flu vaccine has ever had a seizure. People sometimes faint after medical procedures, including vaccination. Tell your provider if you feel dizzy or have vision changes or ringing in the ears. As with any medicine, there is a very remote chance of a vaccine causing a severe allergic reaction, other serious injury, or death. 5. What if there is a serious problem? An allergic reaction could occur after the vaccinated person leaves the clinic. If you see signs of a severe allergic reaction (hives, swelling of the face and  throat, difficulty breathing, a fast heartbeat, dizziness, or weakness), call 9-1-1 and get the person to the nearest hospital. For other signs that concern you, call your health care provider. Adverse reactions should be reported to the Vaccine Adverse Event Reporting System (VAERS). Your health care provider will usually file this report, or you can do it yourself. Visit the VAERS website at www.vaers.SamedayNews.es or call 952-453-0480. VAERS is only for reporting reactions, and VAERS staff members do not give medical advice. 6. The National Vaccine Injury Compensation Program The [employer] Vaccine Injury Compensation Program (VICP) is a federal program that was created to compensate people who may have been injured by certain vaccines. Claims regarding alleged injury or death due to vaccination have a time limit for filing, which may be as short as two years. Visit the VICP website at GoldCloset.com.ee or  call 330 164 5762 to learn about the program and about filing a claim. 7. How can I learn more?  Ask your health care provider.  Call your local or state health department.  Visit the website of the Food and Drug Administration (FDA) for vaccine package inserts and additional information at TraderRating.uy.  Contact the Centers for Disease Control and Prevention (CDC): ? Call 713-425-3628 (1-800-CDC-INFO) or ? Visit CDC's website at https://gibson.com/. Vaccine Information Statement Inactivated Influenza Vaccine (09/13/2019) This information is not intended to replace advice given to you by your health care provider. Make sure you discuss any questions you have with your health care provider. Document Revised: 10/31/2019 Document Reviewed: 10/31/2019 Elsevier Patient Education  2021 [employer].

## 2020-03-18 NOTE — Assessment & Plan Note (Signed)
Uncontrolled on Singulair 10 mg.  Also on some unknown store brand allergy medicine as well  Discussed to stop the store brand allergy medicine. Continue Singulair 10 mg daily, add Xyzal 5 mg daily at the opposite time of day.  We also discussed use of Flonase daily when at her son's house.  She will call to update if no improvement.

## 2020-03-18 NOTE — Assessment & Plan Note (Signed)
Weight loss of 30 pounds since last visit, commended her on this success!  Repeat A1C pending.

## 2020-03-18 NOTE — Assessment & Plan Note (Signed)
Influenza vaccine provided today.  Other immunizations up-to-date, encouraged her to obtain her COVID booster vaccine. Mammogram overdue, she currently declines despite recommendations. Cologuard up-to-date, due again in 2023.  Commended her on weight loss and encouraged her to continue.  Recommended she add in some aerobic exercise.  Exam today unremarkable. Labs pending.

## 2020-03-18 NOTE — Assessment & Plan Note (Signed)
Compliant to atorvastatin 20 mg daily, continue same.  Commended her on her weight loss of 30 pounds, encouraged her to continue.  Repeat lipid panel pending today.

## 2020-03-18 NOTE — Assessment & Plan Note (Addendum)
Chronic, stable, improved since weight loss. Doing well on Meloxicam 7.5 mg BID, continue same. Commended her on weight loss.

## 2020-03-18 NOTE — Assessment & Plan Note (Signed)
Well controlled on amlodipine 5 mg today, continue same. CMP pending.

## 2020-04-04 ENCOUNTER — Other Ambulatory Visit: Payer: Self-pay | Admitting: Primary Care

## 2020-04-04 DIAGNOSIS — J309 Allergic rhinitis, unspecified: Secondary | ICD-10-CM

## 2020-04-08 ENCOUNTER — Other Ambulatory Visit: Payer: Self-pay | Admitting: Primary Care

## 2020-04-08 DIAGNOSIS — I10 Essential (primary) hypertension: Secondary | ICD-10-CM

## 2020-04-10 ENCOUNTER — Other Ambulatory Visit: Payer: Self-pay | Admitting: Primary Care

## 2020-04-10 DIAGNOSIS — J309 Allergic rhinitis, unspecified: Secondary | ICD-10-CM

## 2020-04-10 NOTE — Telephone Encounter (Signed)
How is she doing since we added Xyzal to her regimen for allergies? Helpful? If so then send in #90, 1 refill

## 2020-04-13 NOTE — Telephone Encounter (Signed)
  Called patient reviewed all information and repeated back to me.  Pt states she  wakes up with a headache every morning. She also said she did not take it 1 day and the next morning she did not have a headache. Has been helping with  itchy watery eyes.

## 2020-04-15 NOTE — Telephone Encounter (Signed)
She can try holding the Singulair while taking the Xyzal, see if this helps to prevent headache.  We will send refill.

## 2020-04-16 NOTE — Telephone Encounter (Signed)
Called patient reviewed all information and repeated back to me. Will call if any questions.  ? ?

## 2020-06-04 ENCOUNTER — Other Ambulatory Visit: Payer: Self-pay | Admitting: Primary Care

## 2020-06-04 DIAGNOSIS — E7800 Pure hypercholesterolemia, unspecified: Secondary | ICD-10-CM

## 2020-06-20 DIAGNOSIS — K047 Periapical abscess without sinus: Secondary | ICD-10-CM | POA: Diagnosis not present

## 2020-06-22 ENCOUNTER — Telehealth: Payer: Self-pay

## 2020-06-22 NOTE — Telephone Encounter (Signed)
[facility] Night - Client TELEPHONE ADVICE RECORD Access[facility]urse Patient Name: Crystal Hardin Gender: Female DOB: [birth year] Age: 70 Y 1 M 12 D Return Phone Number: [phone] (Primary) Address: City/ State/ Zip: [place]  [address] Client [facility] Night - Client Client Site  [facility] - Night Physician Vernona Rieger - NP Contact Type Call Who Is Calling Patient / Member / Family / Caregiver Call Type Triage / Clinical Relationship To Patient Self Return Phone Number [phone] (Primary) Chief Complaint Facial Swelling Reason for Call Symptomatic / Request for Health Information Initial Comment Caller states that her face is swelling and she was jaw pain. Caller wants to know if there is anyway for the doctor can prescribe antibiotics. Translation No Nurse Assessment Nurse: Thad Ranger, RN, Yehuda Mao Date/Time (Eastern Time): 06/19/2020 8:59:03 PM Confirm and document reason for call. If symptomatic, describe symptoms. ---Caller states that her face is swelling and tooth pain Does the patient have any new or worsening symptoms? ---Yes Will a triage be completed? ---Yes Related visit to physician within the last 2 weeks? ---No Does the PT have any chronic conditions? (i.e. diabetes, asthma, this includes High risk factors for pregnancy, etc.) ---No Is this a behavioral health or substance abuse call? ---No Guidelines Guideline Title Affirmed Question Affirmed Notes Nurse Date/Time (Eastern Time) Toothache [1] Face is swollen AND [2] no fever Thad Ranger, RN, Yehuda Mao 06/19/2020 9:00:14 PM Disp. Time Lamount Cohen Time) Disposition Final User 06/19/2020 9:05:27 PM See HCP within 4 Hours (or PCP triage) Yes Thad Ranger, RN, Yehuda Mao Disposition Overriden: See Dentist within 24 Hours Override Reason: Patient's symptoms need a higher level of care Caller Disagree/Comply Comply Caller Understands Yes PLEASE  NOTE: All timestamps contained within this report are represented as [place] Standard Time. CONFIDENTIALTY NOTICE: This fax transmission is intended only for the addressee. It contains information that is legally privileged, confidential or otherwise protected from use or disclosure. If you are not the intended recipient, you are strictly prohibited from reviewing, disclosing, copying using or disseminating any of this information or taking any action in reliance on or regarding this information. If you have received this fax in error, please notify us immediately by telephone so that we can arrange for its return to [place]. Phone: [phone], Toll-Free: [phone], Fax: [phone] Page: 2 of 2 Call Id: [id number] PreDisposition Did not know what to do Care Advice Given Per Guideline SEE HCP (OR PCP TRIAGE) WITHIN 4 HOURS: * UCC: Some UCCs can manage patients who are stable and have less serious symptoms (e.g., minor illnesses and injuries). The triager must know the [facility] capabilities before sending a patient there. If unsure, call ahead. * A dentist can handle this type of problem. ALTERNATE DISPOSITION - DENTIST: LOCAL COLD: * Apply a cold pack or ice in a wet washcloth to the painful area of the face for 20 minutes. PAIN MEDICINES: * For pain relief, you can take either acetaminophen, ibuprofen, or naproxen. * They are over-the-counter (OTC) pain drugs. You can buy them at the drugstore. CARE ADVICE given per Toothache (Adult) guideline. * You become worse CALL BACK IF: Comments User: Judeth Cornfield, RN Date/Time (Eastern Time): 06/19/2020 9:05:43 PM Outcome upgraded due to dentist closed. Referrals GO TO FACILITY REFUSED

## 2020-06-23 NOTE — Telephone Encounter (Signed)
Noted, notes reviewed. Treated for dental abscess.

## 2020-07-16 ENCOUNTER — Other Ambulatory Visit: Payer: Self-pay | Admitting: Primary Care

## 2020-07-16 DIAGNOSIS — J309 Allergic rhinitis, unspecified: Secondary | ICD-10-CM

## 2020-10-15 ENCOUNTER — Other Ambulatory Visit: Payer: Self-pay | Admitting: Primary Care

## 2020-10-15 DIAGNOSIS — J309 Allergic rhinitis, unspecified: Secondary | ICD-10-CM

## 2021-02-26 ENCOUNTER — Other Ambulatory Visit: Payer: Self-pay | Admitting: Primary Care

## 2021-02-26 DIAGNOSIS — E7800 Pure hypercholesterolemia, unspecified: Secondary | ICD-10-CM

## 2021-03-11 NOTE — Progress Notes (Signed)
Subjective:   Crystal Hardin is a 71 y.o. female who presents for Medicare Annual (Subsequent) preventive examination.  I connected with Olivette Maryanna Shape today by telephone and verified that I am speaking with the correct person using two identifiers. Location patient: home Location provider: work Persons participating in the virtual visit: patient, Marine scientist.    I discussed the limitations, risks, security and privacy concerns of performing an evaluation and management service by telephone and the availability of in person appointments. I also discussed with the patient that there may be a patient responsible charge related to this service. The patient expressed understanding and verbally consented to this telephonic visit.    Interactive audio and video telecommunications were attempted between this provider and patient, however failed, due to patient having technical difficulties OR patient did not have access to video capability.  We continued and completed visit with audio only.  Some vital signs may be absent or patient reported.   Time Spent with patient on telephone encounter: 20 minutes  Review of Systems     Cardiac Risk Factors include: advanced age (>3men, >65 women);hypertension;dyslipidemia     Objective:    Today's Vitals   03/12/21 1156  Weight: 166 lb (75.3 kg)  Height: 5\' 3"  (1.6 m)   Body mass index is 29.41 kg/m.  Advanced Directives 03/12/2021 03/11/2020 03/04/2019 02/28/2018 06/06/2016 03/21/2015  Does Patient Have a Medical Advance Directive? No No No No No No  Would patient like information on creating a medical advance directive? Yes (MAU/Ambulatory/Procedural Areas - Information given) No - Patient declined Yes (MAU/Ambulatory/Procedural Areas - Information given) No - Patient declined No - Patient declined Yes - Educational materials given    Current Medications (verified) Outpatient Encounter Medications as of 03/12/2021  Medication Sig   amLODipine  (NORVASC) 5 MG tablet TAKE 1 TABLET BY MOUTH DAILY FOR HIGH BLOOD PRESSURE   atorvastatin (LIPITOR) 20 MG tablet TAKE 1 TABLET BY MOUTH EVERY DAY FOR CHOLESTEROL. Office visit required for further refills.   levocetirizine (XYZAL) 5 MG tablet TAKE 1 TABLET BY MOUTH EVERY DAY IN THE EVENING FOR ALLERGIES   meloxicam (MOBIC) 7.5 MG tablet TAKE 1 TABLET (7.5 MG TOTAL) BY MOUTH 2 (TWO) TIMES DAILY AS NEEDED FOR PAIN.   montelukast (SINGULAIR) 10 MG tablet TAKE 1 TABLET BY MOUTH ONCE DAILY FOR ALLERGIES.   No facility-administered encounter medications on file as of 03/12/2021.    Allergies (verified) Patient has no known allergies.   History: Past Medical History:  Diagnosis Date   Allergic rhinitis    GERD (gastroesophageal reflux disease)    Gout    Hyperlipidemia    Hypertension    Osteoarthritis    Past Surgical History:  Procedure Laterality Date   KNEE SURGERY Right    VAGINAL HYSTERECTOMY     left ovary intact, DUB   Family History  Problem Relation Age of Onset   Hyperlipidemia Mother    Hypertension Mother    Cancer Mother        breast   Arthritis Mother    Heart disease Father    Emphysema Father    Hypertension Sister    Hypertension Brother    Dementia Maternal Aunt        Alzheimers   Dementia Maternal Aunt        Alzheimers   Social History   Socioeconomic History   Marital status: Single    Spouse name: Not on file   Number of children: 2  Years of education: Not on file   Highest education level: Not on file  Occupational History   Occupation: Glass blower/designer  Tobacco Use   Smoking status: Never   Smokeless tobacco: Never  Vaping Use   Vaping Use: Never used  Substance and Sexual Activity   Alcohol use: Not Currently    Comment: rarely   Drug use: No   Sexual activity: Not on file  Other Topics Concern   Not on file  Social History Narrative   Divorced   Glass blower/designer   Two children, daughter and son   Social Determinants of Health    Financial Resource Strain: Low Risk    Difficulty of Paying Living Expenses: Not hard at all  Food Insecurity: No Food Insecurity   Worried About Charity fundraiser in the Last Year: Never true   Arboriculturist in the Last Year: Never true  Transportation Needs: No Transportation Needs   Lack of Transportation (Medical): No   Lack of Transportation (Non-Medical): No  Physical Activity: Sufficiently Active   Days of Exercise per Week: 7 days   Minutes of Exercise per Session: 150+ min  Stress: No Stress Concern Present   Feeling of Stress : Not at all  Social Connections: Socially Isolated   Frequency of Communication with Friends and Family: More than three times a week   Frequency of Social Gatherings with Friends and Family: More than three times a week   Attends Religious Services: Never   Marine scientist or Organizations: No   Attends Music therapist: Never   Marital Status: Never married    Tobacco Counseling Counseling given: Not Answered   Clinical Intake:  Pre-visit preparation completed: Yes  Pain : No/denies pain     BMI - recorded: 29.41 Nutritional Status: BMI 25 -29 Overweight Nutritional Risks: None Diabetes: No  How often do you need to have someone help you when you read instructions, pamphlets, or other written materials from your doctor or pharmacy?: 1 - Never  Diabetic? No  Interpreter Needed?: No  Information entered by :: Orrin Brigham LPN   Activities of Daily Living In your present state of health, do you have any difficulty performing the following activities: 03/12/2021  Hearing? N  Vision? Y  Difficulty concentrating or making decisions? N  Walking or climbing stairs? Y  Comment occasionally due to knee injury  Dressing or bathing? N  Doing errands, shopping? N  Preparing Food and eating ? N  Using the Toilet? N  In the past six months, have you accidently leaked urine? N  Do you have problems with loss of  bowel control? N  Managing your Medications? N  Managing your Finances? N  Housekeeping or managing your Housekeeping? N  Some recent data might be hidden    Patient Care Team: Pleas Koch, NP as PCP - General (Internal Medicine)  Indicate any recent Medical Services you may have received from other than [facility] providers in the past year (date may be approximate).     Assessment:   This is a routine wellness examination for A[place].  Hearing/Vision screen Hearing Screening - Comments:: No issues Vision Screening - Comments:: Last exam 2 years ago, Plans to make an appointment , wears glasses   Dietary issues and exercise activities discussed: Current Exercise Habits: The patient has a physically strenuous job, but has no regular exercise apart from work. (does a lot walking 3,500 to 5,000 steps per day)  Goals Addressed             This Visit's Progress    Patient Stated       Would like to maintain current routine and drink more water.        Depression Screen PHQ 2/9 Scores 03/12/2021 03/11/2020 03/04/2019 02/28/2018  PHQ - 2 Score 0 0 0 0  PHQ- 9 Score - 0 0 0    Fall Risk Fall Risk  03/12/2021 03/11/2020 03/04/2019 02/28/2018  Falls in the past year? 0 0 1 1  Comment - - tripped and fell pt tripped over a stool and went to ER  Number falls in past yr: 0 0 0 0  Injury with Fall? 0 0 0 1  Risk for fall due to : No Fall Risks No Fall Risks Medication side effect -  Follow up Falls prevention discussed Falls evaluation completed;Falls prevention discussed Falls evaluation completed;Falls prevention discussed -    FALL RISK PREVENTION PERTAINING TO THE HOME:  Any stairs in or around the home? Yes  If so, are there any without handrails? No  Home free of loose throw rugs in walkways, pet beds, electrical cords, etc? Yes  Adequate lighting in your home to reduce risk of falls? Yes   ASSISTIVE DEVICES UTILIZED TO PREVENT FALLS:  Life alert? No  Use of a cane, walker  or w/c? No  Grab bars in the bathroom? No  Shower chair or bench in shower? No  Elevated toilet seat or a handicapped toilet? Yes   TIMED UP AND GO:  Was the test performed? No .    Cognitive Function: Normal cognitive status assessed by this Nurse Health Advisor. No abnormalities found.   MMSE - Mini Mental State Exam 03/11/2020 03/04/2019 02/28/2018  Not completed: Refused - -  Orientation to time - 5 5  Orientation to Place - 5 5  Registration - 3 3  Attention/ Calculation - 5 0  Recall - 3 3  Language- name 2 objects - - 0  Language- repeat - 1 1  Language- follow 3 step command - - 3  Language- read & follow direction - - 0  Write a sentence - - 0  Copy design - - 0  Total score - - 20        Immunizations Immunization History  Administered Date(s) Administered   Fluad Quad(high Dose 65+) 03/18/2020   Influenza, High Dose Seasonal PF 11/08/2018   Influenza, Seasonal, Injecte, Preservative Fre 12/08/2015   Influenza,inj,Quad PF,6+ Mos 10/21/2013, 11/04/2014, 03/14/2018   PFIZER(Purple Top)SARS-COV-2 Vaccination 04/07/2019, 04/29/2019   Pneumococcal Conjugate-13 03/18/2019   Pneumococcal Polysaccharide-23 03/14/2018   Td 01/18/2006   Tdap 05/17/2017, 11/11/2019   Zoster Recombinat (Shingrix) 11/08/2018, 05/16/2019    TDAP status: Up to date  Flu Vaccine status: Due, Education has been provided regarding the importance of this vaccine. Advised may receive this vaccine at local pharmacy or Health Dept. Aware to provide a copy of the vaccination record if obtained from local pharmacy or Health Dept. Verbalized acceptance and understanding.  Pneumococcal vaccine status: Up to date  Covid-19 vaccine status: Information provided on how to obtain vaccines.   Qualifies for Shingles Vaccine? Yes   Zostavax completed No   Shingrix Completed?: Yes  Screening Tests Health Maintenance  Topic Date Due   MAMMOGRAM  06/22/2012   DEXA SCAN  Never done   COVID-19 Vaccine  (3 - Booster for Pfizer series) 06/24/2019   INFLUENZA VACCINE  09/07/2020   Fecal  DNA (Cologuard)  04/08/2021   TETANUS/TDAP  11/10/2029   Pneumonia Vaccine 71+ Years old  Completed   Hepatitis C Screening  Completed   Zoster Vaccines- Shingrix  Completed   HPV VACCINES  Aged Out    Health Maintenance  Health Maintenance Due  Topic Date Due   MAMMOGRAM  06/22/2012   DEXA SCAN  Never done   COVID-19 Vaccine (3 - Booster for Pfizer series) 06/24/2019   INFLUENZA VACCINE  09/07/2020    Colorectal cancer screening: Type of screening: Cologuard. Completed 04/09/18. Repeat every 3 years  Mammogram status: due, last completed 06/23/10, patient plans to discuss with PCP when decided  Bone Density status: due, patient plans on discussing with PCP when decided   Lung Cancer Screening: (Low Dose CT Chest recommended if Age 24-80 years, 30 pack-year currently smoking OR have quit w/in 15years.) does not qualify.     Additional Screening:  Hepatitis C Screening: does qualify; Completed 02/28/18  Vision Screening: Recommended annual ophthalmology exams for early detection of glaucoma and other disorders of the eye. Is the patient up to date with their annual eye exam?  No  Who is the provider or what is the name of the office in which the patient attends annual eye exams? Provider information unavailable    Dental Screening: Recommended annual dental exams for proper oral hygiene  Community Resource Referral / Chronic Care Management: CRR required this visit?  No   CCM required this visit?  No      Plan:     I have personally reviewed and noted the following in the patients chart:   Medical and social history Use of alcohol, tobacco or illicit drugs  Current medications and supplements including opioid prescriptions.  Functional ability and status Nutritional status Physical activity Advanced directives List of other physicians Hospitalizations, surgeries, and ER visits  in previous 12 months Vitals Screenings to include cognitive, depression, and falls Referrals and appointments  In addition, I have reviewed and discussed with patient certain preventive protocols, quality metrics, and best practice recommendations. A written personalized care plan for preventive services as well as general preventive health recommendations were provided to patient.   Due to this being a telephonic visit, the after visit summary with patients personalized plan was offered to patient via mail or my-chart. Patient would like to access on my-chart.   Loma Messing, LPN   [phone]   Nurse Health Advisor  Nurse Notes: none

## 2021-03-12 ENCOUNTER — Ambulatory Visit (INDEPENDENT_AMBULATORY_CARE_PROVIDER_SITE_OTHER): Payer: Medicare HMO

## 2021-03-12 VITALS — Ht 63.0 in | Wt 166.0 lb

## 2021-03-12 DIAGNOSIS — Z1211 Encounter for screening for malignant neoplasm of colon: Secondary | ICD-10-CM | POA: Diagnosis not present

## 2021-03-12 DIAGNOSIS — Z0000 Encounter for general adult medical examination without abnormal findings: Secondary | ICD-10-CM

## 2021-03-12 NOTE — Patient Instructions (Signed)
[name] , Thank you for taking time to complete your Medicare Wellness Visit. I appreciate your ongoing commitment to your health goals. Please review the following plan we discussed and let me know if I can assist you in the future.   Screening recommendations/referrals: Colonoscopy: cologuard completed 04/09/18, ordered today, someone will give you a call to set up shipment  Mammogram: due, last completed 06/23/10, discuss with PCP if you change your mind Bone Density: due, discuss with PCP if you change your mind.  Recommended yearly ophthalmology/optometry visit for glaucoma screening and checkup Recommended yearly dental visit for hygiene and checkup  Vaccinations: Influenza vaccine: Due-last completed 03/18/20, May obtain vaccine at our office or your local pharmacy. Pneumococcal vaccine: up to date Tdap vaccine: up to date, completed 11/11/19, due 11/10/29 Shingles vaccine: up to date   Covid-19:newest booster available at your local pharmacy   Advanced directives: information available at your next appointment   Conditions/risks identified: see problem list   Next appointment: Follow up in one year for your annual wellness visit 03/14/22 @ 3:30pm, this will be a telephone visit.    Preventive Care 70 Years and Older, Female Preventive care refers to lifestyle choices and visits with your health care provider that can promote health and wellness. What does preventive care include? A yearly physical exam. This is also called an annual well check. Dental exams once or twice a year. Routine eye exams. Ask your health care provider how often you should have your eyes checked. Personal lifestyle choices, including: Daily care of your teeth and gums. Regular physical activity. Eating a healthy diet. Avoiding tobacco and drug use. Limiting alcohol use. Practicing safe sex. Taking low-dose aspirin every day. Taking vitamin and mineral supplements as recommended by your health care  provider. What happens during an annual well check? The services and screenings done by your health care provider during your annual well check will depend on your age, overall health, lifestyle risk factors, and family history of disease. Counseling  Your health care provider may ask you questions about your: Alcohol use. Tobacco use. Drug use. Emotional well-being. Home and relationship well-being. Sexual activity. Eating habits. History of falls. Memory and ability to understand (cognition). Work and work Astronomer. Reproductive health. Screening  You may have the following tests or measurements: Height, weight, and BMI. Blood pressure. Lipid and cholesterol levels. These may be checked every 5 years, or more frequently if you are over 33 years old. Skin check. Lung cancer screening. You may have this screening every year starting at age 20 if you have a 30-pack-year history of smoking and currently smoke or have quit within the past 15 years. Fecal occult blood test (FOBT) of the stool. You may have this test every year starting at age 62. Flexible sigmoidoscopy or colonoscopy. You may have a sigmoidoscopy every 5 years or a colonoscopy every 10 years starting at age 22. Hepatitis C blood test. Hepatitis B blood test. Sexually transmitted disease (STD) testing. Diabetes screening. This is done by checking your blood sugar (glucose) after you have not eaten for a while (fasting). You may have this done every 1-3 years. Bone density scan. This is done to screen for osteoporosis. You may have this done starting at age 70. Mammogram. This may be done every 1-2 years. Talk to your health care provider about how often you should have regular mammograms. Talk with your health care provider about your test results, treatment options, and if necessary, the need for more  tests. Vaccines  Your health care provider may recommend certain vaccines, such as: Influenza vaccine. This is  recommended every year. Tetanus, diphtheria, and acellular pertussis (Tdap, Td) vaccine. You may need a Td booster every 10 years. Zoster vaccine. You may need this after age 43. Pneumococcal 13-valent conjugate (PCV13) vaccine. One dose is recommended after age 2. Pneumococcal polysaccharide (PPSV23) vaccine. One dose is recommended after age 70. Talk to your health care provider about which screenings and vaccines you need and how often you need them. This information is not intended to replace advice given to you by your health care provider. Make sure you discuss any questions you have with your health care provider. Document Released: 02/20/2015 Document Revised: 10/14/2015 Document Reviewed: 11/25/2014 Elsevier Interactive Patient Education  2017 [employer] Prevention in the Home Falls can cause injuries. They can happen to people of all ages. There are many things you can do to make your home safe and to help prevent falls. What can I do on the outside of my home? Regularly fix the edges of walkways and driveways and fix any cracks. Remove anything that might make you trip as you walk through a door, such as a raised step or threshold. Trim any bushes or trees on the path to your home. Use bright outdoor lighting. Clear any walking paths of anything that might make someone trip, such as rocks or tools. Regularly check to see if handrails are loose or broken. Make sure that both sides of any steps have handrails. Any raised decks and porches should have guardrails on the edges. Have any leaves, snow, or ice cleared regularly. Use sand or salt on walking paths during winter. Clean up any spills in your garage right away. This includes oil or grease spills. What can I do in the bathroom? Use night lights. Install grab bars by the toilet and in the tub and shower. Do not use towel bars as grab bars. Use non-skid mats or decals in the tub or shower. If you need to sit down in  the shower, use a plastic, non-slip stool. Keep the floor dry. Clean up any water that spills on the floor as soon as it happens. Remove soap buildup in the tub or shower regularly. Attach bath mats securely with double-sided non-slip rug tape. Do not have throw rugs and other things on the floor that can make you trip. What can I do in the bedroom? Use night lights. Make sure that you have a light by your bed that is easy to reach. Do not use any sheets or blankets that are too big for your bed. They should not hang down onto the floor. Have a firm chair that has side arms. You can use this for support while you get dressed. Do not have throw rugs and other things on the floor that can make you trip. What can I do in the kitchen? Clean up any spills right away. Avoid walking on wet floors. Keep items that you use a lot in easy-to-reach places. If you need to reach something above you, use a strong step stool that has a grab bar. Keep electrical cords out of the way. Do not use floor polish or wax that makes floors slippery. If you must use wax, use non-skid floor wax. Do not have throw rugs and other things on the floor that can make you trip. What can I do with my stairs? Do not leave any items on the stairs. Make sure  that there are handrails on both sides of the stairs and use them. Fix handrails that are broken or loose. Make sure that handrails are as long as the stairways. Check any carpeting to make sure that it is firmly attached to the stairs. Fix any carpet that is loose or worn. Avoid having throw rugs at the top or bottom of the stairs. If you do have throw rugs, attach them to the floor with carpet tape. Make sure that you have a light switch at the top of the stairs and the bottom of the stairs. If you do not have them, ask someone to add them for you. What else can I do to help prevent falls? Wear shoes that: Do not have high heels. Have rubber bottoms. Are comfortable  and fit you well. Are closed at the toe. Do not wear sandals. If you use a stepladder: Make sure that it is fully opened. Do not climb a closed stepladder. Make sure that both sides of the stepladder are locked into place. Ask someone to hold it for you, if possible. Clearly mark and make sure that you can see: Any grab bars or handrails. First and last steps. Where the edge of each step is. Use tools that help you move around (mobility aids) if they are needed. These include: Canes. Walkers. Scooters. Crutches. Turn on the lights when you go into a dark area. Replace any light bulbs as soon as they burn out. Set up your furniture so you have a clear path. Avoid moving your furniture around. If any of your floors are uneven, fix them. If there are any pets around you, be aware of where they are. Review your medicines with your doctor. Some medicines can make you feel dizzy. This can increase your chance of falling. Ask your doctor what other things that you can do to help prevent falls. This information is not intended to replace advice given to you by your health care provider. Make sure you discuss any questions you have with your health care provider. Document Released: 11/20/2008 Document Revised: 07/02/2015 Document Reviewed: 02/28/2014 Elsevier Interactive Patient Education  2017 [employer].

## 2021-03-18 DIAGNOSIS — H524 Presbyopia: Secondary | ICD-10-CM | POA: Diagnosis not present

## 2021-03-20 ENCOUNTER — Other Ambulatory Visit: Payer: Self-pay | Admitting: Primary Care

## 2021-03-20 DIAGNOSIS — J309 Allergic rhinitis, unspecified: Secondary | ICD-10-CM

## 2021-03-26 ENCOUNTER — Other Ambulatory Visit: Payer: Self-pay | Admitting: Primary Care

## 2021-03-26 DIAGNOSIS — E7800 Pure hypercholesterolemia, unspecified: Secondary | ICD-10-CM

## 2021-03-26 NOTE — Telephone Encounter (Signed)
Patient is overdue for CPE and needs to be scheduled soon. Anytime, any day.

## 2021-04-03 ENCOUNTER — Other Ambulatory Visit: Payer: Self-pay | Admitting: Primary Care

## 2021-04-03 DIAGNOSIS — I10 Essential (primary) hypertension: Secondary | ICD-10-CM

## 2021-04-04 NOTE — Telephone Encounter (Signed)
Patient is overdue for CPE with me. Needs to be scheduled. Any day, any time, any slot.

## 2021-04-06 NOTE — Telephone Encounter (Signed)
Sent my chart letter and lvm for pt to schedule cpe/lab

## 2021-04-07 ENCOUNTER — Other Ambulatory Visit: Payer: Self-pay | Admitting: Primary Care

## 2021-04-07 DIAGNOSIS — J309 Allergic rhinitis, unspecified: Secondary | ICD-10-CM

## 2021-04-07 NOTE — Telephone Encounter (Signed)
Pt is scheduled for a cpe.  ?

## 2021-04-17 ENCOUNTER — Other Ambulatory Visit: Payer: Self-pay | Admitting: Primary Care

## 2021-04-17 DIAGNOSIS — J309 Allergic rhinitis, unspecified: Secondary | ICD-10-CM

## 2021-04-20 ENCOUNTER — Other Ambulatory Visit: Payer: Self-pay | Admitting: Primary Care

## 2021-04-20 DIAGNOSIS — E7800 Pure hypercholesterolemia, unspecified: Secondary | ICD-10-CM

## 2021-04-23 DIAGNOSIS — Z1211 Encounter for screening for malignant neoplasm of colon: Secondary | ICD-10-CM | POA: Diagnosis not present

## 2021-04-30 LAB — COLOGUARD: COLOGUARD: NEGATIVE

## 2021-05-02 ENCOUNTER — Other Ambulatory Visit: Payer: Self-pay | Admitting: Primary Care

## 2021-05-02 DIAGNOSIS — I10 Essential (primary) hypertension: Secondary | ICD-10-CM

## 2021-05-14 ENCOUNTER — Other Ambulatory Visit: Payer: Self-pay | Admitting: Primary Care

## 2021-05-14 DIAGNOSIS — E7800 Pure hypercholesterolemia, unspecified: Secondary | ICD-10-CM

## 2021-05-14 DIAGNOSIS — J309 Allergic rhinitis, unspecified: Secondary | ICD-10-CM

## 2021-05-20 ENCOUNTER — Encounter: Payer: Self-pay | Admitting: Primary Care

## 2021-05-20 ENCOUNTER — Ambulatory Visit (INDEPENDENT_AMBULATORY_CARE_PROVIDER_SITE_OTHER): Payer: Medicare HMO | Admitting: Primary Care

## 2021-05-20 VITALS — BP 126/64 | HR 107 | Temp 97.6°F | Ht 63.0 in | Wt 174.0 lb

## 2021-05-20 DIAGNOSIS — Z1231 Encounter for screening mammogram for malignant neoplasm of breast: Secondary | ICD-10-CM

## 2021-05-20 DIAGNOSIS — M2550 Pain in unspecified joint: Secondary | ICD-10-CM

## 2021-05-20 DIAGNOSIS — I10 Essential (primary) hypertension: Secondary | ICD-10-CM | POA: Diagnosis not present

## 2021-05-20 DIAGNOSIS — K2100 Gastro-esophageal reflux disease with esophagitis, without bleeding: Secondary | ICD-10-CM

## 2021-05-20 DIAGNOSIS — R7303 Prediabetes: Secondary | ICD-10-CM | POA: Diagnosis not present

## 2021-05-20 DIAGNOSIS — M5441 Lumbago with sciatica, right side: Secondary | ICD-10-CM | POA: Diagnosis not present

## 2021-05-20 DIAGNOSIS — G8929 Other chronic pain: Secondary | ICD-10-CM

## 2021-05-20 DIAGNOSIS — E7800 Pure hypercholesterolemia, unspecified: Secondary | ICD-10-CM

## 2021-05-20 DIAGNOSIS — E2839 Other primary ovarian failure: Secondary | ICD-10-CM

## 2021-05-20 DIAGNOSIS — J309 Allergic rhinitis, unspecified: Secondary | ICD-10-CM

## 2021-05-20 DIAGNOSIS — Z0001 Encounter for general adult medical examination with abnormal findings: Secondary | ICD-10-CM

## 2021-05-20 MED ORDER — PREDNISONE 20 MG PO TABS: ORAL_TABLET | ORAL | 0 refills | Status: DC

## 2021-05-20 NOTE — Assessment & Plan Note (Signed)
Active symptoms, overall stable on current regiemen  ? ?Continue Meloxicam 7.5 mg daily. ?Continue Tylenol PRN.  ? ?

## 2021-05-20 NOTE — Assessment & Plan Note (Signed)
Controlled overall, some seasonal symptoms.  ? ?Continue Singulair 10 mg HS and Xyzal 5 mg HS.  ?

## 2021-05-20 NOTE — Assessment & Plan Note (Signed)
Controlled. ? ?Continue amlodipine 5 mg daily. ? ?Labs pending. ?

## 2021-05-20 NOTE — Assessment & Plan Note (Signed)
Immunizations up-to-date. ?Mammogram due, orders placed. ?Bone density scan due, orders placed. ?Colon cancer screening up-to-date, due 2026. ? ?Discussed the importance of a healthy diet and regular exercise in order for weight loss, and to reduce the risk of further co-morbidity. ? ?Exam today as noted. ?Labs pending ?

## 2021-05-20 NOTE — Assessment & Plan Note (Signed)
Continue atorvastatin 20 mg daily. Repeat lipid panel pending. 

## 2021-05-20 NOTE — Assessment & Plan Note (Signed)
Active flare today. ? ?Rx for prednisone 20 mg regimen sent to pharmacy.  ?Discussed stretching exercises.  ? ?Continue Tylenol as needed. ?

## 2021-05-20 NOTE — Progress Notes (Signed)
? ?Subjective:  ? ? Patient ID: Crystal Hardin, female    DOB: [birth year], 71 y.o.   MRN: [id number] ? ?HPI ? ?Crystal Hardin is a very pleasant 71 y.o. female who presents today for complete physical and follow up of chronic conditions. ? ?She would also like to discuss right sided back pain with sciatica. Chronic history, but persistent symptoms for the last 2-3 weeks. She's taking Meloxicam 7.5 mg once daily and Tylenol throughout the day without improvement.  Typically this regimen will knock out her symptoms within a day or 2.  She keeps both of her young grandchildren, does a lot of lifting and maneuvering.  She denies loss of bowel or bladder control.  ? ?Immunizations: ?-Tetanus: 2021 ?-Influenza: Completed last season  ?-Covid-19: 2 vaccines ?-Shingles: Completed Shingrix ?-Pneumonia:  Prevnar 13 in 2021, Pneumovax in 2020 ? ?Diet: Fair diet.  ?Exercise: No regular exercise. ? ?Eye exam: Completes annually  ?Dental exam: Completes semi-annually  ? ?Mammogram: Declined last year, agrees this year ?Colonoscopy: Completed Cologuard in 2023 ?Bone Density Scan: Due ? ?BP Readings from Last 3 Encounters:  ?05/20/21 126/64  ?03/18/20 130/64  ?03/18/19 130/84  ? ? ? ? ? ?Review of Systems  ?Constitutional:  Negative for unexpected weight change.  ?HENT:  Negative for rhinorrhea.   ?Eyes:  Negative for visual disturbance.  ?Respiratory:  Negative for cough and shortness of breath.   ?Cardiovascular:  Negative for chest pain.  ?Gastrointestinal:  Negative for constipation and diarrhea.  ?Genitourinary:  Negative for difficulty urinating.  ?Musculoskeletal:  Positive for arthralgias and back pain. Negative for myalgias.  ?Skin:  Negative for rash.  ?Allergic/Immunologic: Negative for environmental allergies.  ?Neurological:  Negative for dizziness, numbness and headaches.  ?Psychiatric/Behavioral:  The patient is not nervous/anxious.   ? ?   ? ? ?Past Medical History:  ?Diagnosis Date  ? Allergic rhinitis   ? GERD  (gastroesophageal reflux disease)   ? Gout   ? Hyperlipidemia   ? Hypertension   ? Osteoarthritis   ? ? ?Social History  ? ?Socioeconomic History  ? Marital status: Single  ?  Spouse name: Not on file  ? Number of children: 2  ? Years of education: Not on file  ? Highest education level: Not on file  ?Occupational History  ? Occupation: Print production planner  ?Tobacco Use  ? Smoking status: Never  ? Smokeless tobacco: Never  ?Vaping Use  ? Vaping Use: Never used  ?Substance and Sexual Activity  ? Alcohol use: Not Currently  ?  Comment: rarely  ? Drug use: No  ? Sexual activity: Not on file  ?Other Topics Concern  ? Not on file  ?Social History Narrative  ? Divorced  ? Print production planner  ? Two children, daughter and son  ? ?Social Determinants of Health  ? ?Financial Resource Strain: Low Risk   ? Difficulty of Paying Living Expenses: Not hard at all  ?Food Insecurity: No Food Insecurity  ? Worried About Programme researcher, broadcasting/film/video in the Last Year: Never true  ? Ran Out of Food in the Last Year: Never true  ?Transportation Needs: No Transportation Needs  ? Lack of Transportation (Medical): No  ? Lack of Transportation (Non-Medical): No  ?Physical Activity: Sufficiently Active  ? Days of Exercise per Week: 7 days  ? Minutes of Exercise per Session: 150+ min  ?Stress: No Stress Concern Present  ? Feeling of Stress : Not at all  ?Social Connections: Socially Isolated  ? Frequency  of Communication with Friends and Family: More than three times a week  ? Frequency of Social Gatherings with Friends and Family: More than three times a week  ? Attends Religious Services: Never  ? Active Member of Clubs or Organizations: No  ? Attends Banker Meetings: Never  ? Marital Status: Never married  ?Intimate Partner Violence: Not At Risk  ? Fear of Current or Ex-Partner: No  ? Emotionally Abused: No  ? Physically Abused: No  ? Sexually Abused: No  ? ? ?Past Surgical History:  ?Procedure Laterality Date  ? KNEE SURGERY Right   ? VAGINAL  HYSTERECTOMY    ? left ovary intact, DUB  ? ? ?Family History  ?Problem Relation Age of Onset  ? Hyperlipidemia Mother   ? Hypertension Mother   ? Cancer Mother   ?     breast  ? Arthritis Mother   ? Heart disease Father   ? Emphysema Father   ? Hypertension Sister   ? Hypertension Brother   ? Dementia Maternal Aunt   ?     Alzheimers  ? Dementia Maternal Aunt   ?     Alzheimers  ? ? ?No Known Allergies ? ?Current Outpatient Medications on File Prior to Visit  ?Medication Sig Dispense Refill  ? amLODipine (NORVASC) 5 MG tablet TAKE 1 TABLET (5 MG TOTAL) BY MOUTH DAILY. FOR HIGH BLOOD PRESSURE. OFFICE VISIT REQUIRED FOR FURTHER REFILLS. 30 tablet 0  ? atorvastatin (LIPITOR) 20 MG tablet TAKE 1 TABLET BY MOUTH EVERY DAY FOR CHOLESTEROL. OFFICE VISIT REQUIRED FOR FURTHER REFILLS. 30 tablet 0  ? levocetirizine (XYZAL) 5 MG tablet Take 1 tablet (5 mg total) by mouth every evening. For allergies. Office visit required for further refills. 60 tablet 0  ? meloxicam (MOBIC) 7.5 MG tablet TAKE 1 TABLET (7.5 MG TOTAL) BY MOUTH 2 (TWO) TIMES DAILY AS NEEDED FOR PAIN. 180 tablet 0  ? montelukast (SINGULAIR) 10 MG tablet TAKE 1 TABLET BY MOUTH ONCE DAILY FOR ALLERGIES. OFFICE VISIT REQUIRED FOR FURTHER REFILLS. 30 tablet 0  ? ?No current facility-administered medications on file prior to visit.  ? ? ?BP 126/64   Pulse (!) 107   Temp 97.6 ?F (36.4 ?C) (Oral)   Ht 5\' 3"  (1.6 m)   Wt 174 lb (78.9 kg)   SpO2 97%   BMI 30.82 kg/m?  ?Objective:  ? Physical Exam ?HENT:  ?   Right Ear: Tympanic membrane and ear canal normal.  ?   Left Ear: Tympanic membrane and ear canal normal.  ?   Nose: Nose normal.  ?Eyes:  ?   Conjunctiva/sclera: Conjunctivae normal.  ?   Pupils: Pupils are equal, round, and reactive to light.  ?Neck:  ?   Thyroid: No thyromegaly.  ?Cardiovascular:  ?   Rate and Rhythm: Normal rate and regular rhythm.  ?   Heart sounds: No murmur heard. ?Pulmonary:  ?   Effort: Pulmonary effort is normal.  ?   Breath sounds:  Normal breath sounds. No rales.  ?Abdominal:  ?   General: Bowel sounds are normal.  ?   Palpations: Abdomen is soft.  ?   Tenderness: There is no abdominal tenderness.  ?Musculoskeletal:  ?   Cervical back: Neck supple.  ?   Lumbar back: No tenderness. Decreased range of motion. Negative right straight leg raise test and negative left straight leg raise test.  ?     Back: ? ?     Legs: ? ?  Comments: 5 out of 5 strength to bilateral lower extremities  ?Lymphadenopathy:  ?   Cervical: No cervical adenopathy.  ?Skin: ?   General: Skin is warm and dry.  ?   Findings: No rash.  ?Neurological:  ?   Mental Status: She is alert and oriented to person, place, and time.  ?   Cranial Nerves: No cranial nerve deficit.  ?   Deep Tendon Reflexes: Reflexes are normal and symmetric.  ?Psychiatric:     ?   Mood and Affect: Mood normal.  ? ? ? ? ? ?   ?Assessment & Plan:  ? ? ? ? ?This visit occurred during the SARS-CoV-2 public health emergency.  Safety protocols were in place, including screening questions prior to the visit, additional usage of staff PPE, and extensive cleaning of exam room while observing appropriate contact time as indicated for disinfecting solutions.  ?

## 2021-05-20 NOTE — Patient Instructions (Addendum)
Stop by the lab prior to leaving today. I will notify you of your results once received.  ? ?Call the [facility] to schedule your mammogram and bone density scan.  ? ?Start prednisone tablets for the sciatica. Take two tablets my mouth once daily for four days, then one tablet once daily for four days.  ? ?It was a pleasure to see you today! ? ?Preventive Care 32 Years and Older, Female ?Preventive care refers to lifestyle choices and visits with your health care provider that can promote health and wellness. Preventive care visits are also called wellness exams. ?What can I expect for my preventive care visit? ?Counseling ?Your health care provider may ask you questions about your: ?Medical history, including: ?Past medical problems. ?Family medical history. ?Pregnancy and menstrual history. ?History of falls. ?Current health, including: ?Memory and ability to understand (cognition). ?Emotional well-being. ?Home life and relationship well-being. ?Sexual activity and sexual health. ?Lifestyle, including: ?Alcohol, nicotine or tobacco, and drug use. ?Access to firearms. ?Diet, exercise, and sleep habits. ?Work and work Statistician. ?Sunscreen use. ?Safety issues such as seatbelt and bike helmet use. ?Physical exam ?Your health care provider will check your: ?Height and weight. These may be used to calculate your BMI (body mass index). BMI is a measurement that tells if you are at a healthy weight. ?Waist circumference. This measures the distance around your waistline. This measurement also tells if you are at a healthy weight and may help predict your risk of certain diseases, such as type 2 diabetes and high blood pressure. ?Heart rate and blood pressure. ?Body temperature. ?Skin for abnormal spots. ?What immunizations do I need? ?Vaccines are usually given at various ages, according to a schedule. Your health care provider will recommend vaccines for you based on your age, medical history, and lifestyle or other  factors, such as travel or where you work. ?What tests do I need? ?Screening ?Your health care provider may recommend screening tests for certain conditions. This may include: ?Lipid and cholesterol levels. ?Hepatitis C test. ?Hepatitis B test. ?HIV (human immunodeficiency virus) test. ?STI (sexually transmitted infection) testing, if you are at risk. ?Lung cancer screening. ?Colorectal cancer screening. ?Diabetes screening. This is done by checking your blood sugar (glucose) after you have not eaten for a while (fasting). ?Mammogram. Talk with your health care provider about how often you should have regular mammograms. ?BRCA-related cancer screening. This may be done if you have a family history of breast, ovarian, tubal, or peritoneal cancers. ?Bone density scan. This is done to screen for osteoporosis. ?Talk with your health care provider about your test results, treatment options, and if necessary, the need for more tests. ?Follow these instructions at home: ?Eating and drinking ? ?Eat a diet that includes fresh fruits and vegetables, whole grains, lean protein, and low-fat dairy products. Limit your intake of foods with high amounts of sugar, saturated fats, and salt. ?Take vitamin and mineral supplements as recommended by your health care provider. ?Do not drink alcohol if your health care provider tells you not to drink. ?If you drink alcohol: ?Limit how much you have to 0-1 drink a day. ?Know how much alcohol is in your drink. In the [place] one drink equals one 12 oz bottle of beer (355 mL), one 5 oz glass of wine (148 mL), or one 1? oz glass of hard liquor (44 mL). ?Lifestyle ?Brush your teeth every morning and night with fluoride toothpaste. Floss one time each day. ?Exercise for at least 30 minutes 5 or  more days each week. ?Do not use any products that contain nicotine or tobacco. These products include cigarettes, chewing tobacco, and vaping devices, such as e-cigarettes. If you need help quitting, ask  your health care provider. ?Do not use drugs. ?If you are sexually active, practice safe sex. Use a condom or other form of protection in order to prevent STIs. ?Take aspirin only as told by your health care provider. Make sure that you understand how much to take and what form to take. Work with your health care provider to find out whether it is safe and beneficial for you to take aspirin daily. ?Ask your health care provider if you need to take a cholesterol-lowering medicine (statin). ?Find healthy ways to manage stress, such as: ?Meditation, yoga, or listening to music. ?Journaling. ?Talking to a trusted person. ?Spending time with friends and family. ?Minimize exposure to UV radiation to reduce your risk of skin cancer. ?Safety ?Always wear your seat belt while driving or riding in a vehicle. ?Do not drive: ?If you have been drinking alcohol. Do not ride with someone who has been drinking. ?When you are tired or distracted. ?While texting. ?If you have been using any mind-altering substances or drugs. ?Wear a helmet and other protective equipment during sports activities. ?If you have firearms in your house, make sure you follow all gun safety procedures. ?What's next? ?Visit your health care provider once a year for an annual wellness visit. ?Ask your health care provider how often you should have your eyes and teeth checked. ?Stay up to date on all vaccines. ?This information is not intended to replace advice given to you by your health care provider. Make sure you discuss any questions you have with your health care provider. ?Document Revised: 07/22/2020 Document Reviewed: 07/22/2020 ?Elsevier Patient Education ? [place]. ? ?

## 2021-05-20 NOTE — Assessment & Plan Note (Signed)
Discussed the importance of a healthy diet and regular exercise in order for weight loss, and to reduce the risk of further co-morbidity. ? ?Repeat A1C pending. ?

## 2021-05-20 NOTE — Assessment & Plan Note (Signed)
Overall infrequent. ? ?Infrequent use of Tums. ?

## 2021-05-21 LAB — LIPID PANEL
Cholesterol: 175 mg/dL (ref 0–200)
HDL: 71.1 mg/dL (ref >39.00)
LDL Cholesterol: 86 mg/dL (ref 0–99)
NonHDL: 103.4
Total CHOL/HDL Ratio: 2
Triglycerides: 85 mg/dL (ref 0.0–149.0)
VLDL: 17 mg/dL (ref 0.0–40.0)

## 2021-05-21 LAB — COMPREHENSIVE METABOLIC PANEL
ALT: 12 U/L (ref 0–35)
AST: 20 U/L (ref 0–37)
Albumin: 4 g/dL (ref 3.5–5.2)
Alkaline Phosphatase: 61 U/L (ref 39–117)
BUN: 16 mg/dL (ref 6–23)
CO2: 30 mEq/L (ref 19–32)
Calcium: 9.5 mg/dL (ref 8.4–10.5)
Chloride: 102 mEq/L (ref 96–112)
Creatinine, Ser: 0.8 mg/dL (ref 0.40–1.20)
GFR: 74.86 mL/min (ref >60.00)
Glucose, Bld: 92 mg/dL (ref 70–99)
Potassium: 4.1 mEq/L (ref 3.5–5.1)
Sodium: 138 mEq/L (ref 135–145)
Total Bilirubin: 0.4 mg/dL (ref 0.2–1.2)
Total Protein: 6.9 g/dL (ref 6.0–8.3)

## 2021-05-21 LAB — HEMOGLOBIN A1C: Hgb A1c MFr Bld: 5.5 % (ref 4.6–6.5)

## 2021-05-24 ENCOUNTER — Other Ambulatory Visit: Payer: Self-pay | Admitting: Primary Care

## 2021-05-24 DIAGNOSIS — I10 Essential (primary) hypertension: Secondary | ICD-10-CM

## 2021-06-01 ENCOUNTER — Ambulatory Visit
Admission: EM | Admit: 2021-06-01 | Discharge: 2021-06-01 | Disposition: A | Discharge status: Home / Self Care | Payer: Medicare HMO | Attending: Emergency Medicine | Admitting: Emergency Medicine

## 2021-06-01 ENCOUNTER — Other Ambulatory Visit: Payer: Self-pay | Admitting: Primary Care

## 2021-06-01 ENCOUNTER — Encounter: Payer: Self-pay | Admitting: Emergency Medicine

## 2021-06-01 DIAGNOSIS — J069 Acute upper respiratory infection, unspecified: Secondary | ICD-10-CM

## 2021-06-01 DIAGNOSIS — J309 Allergic rhinitis, unspecified: Secondary | ICD-10-CM

## 2021-06-01 MED ORDER — PROMETHAZINE-DM 6.25-15 MG/5ML PO SYRP: 5.0000 mL | ORAL_SOLUTION | Freq: Four times a day (QID) | ORAL | 0 refills | Status: DC | PRN

## 2021-06-01 MED ORDER — PREDNISONE 20 MG PO TABS: 40.0000 mg | ORAL_TABLET | Freq: Every day | ORAL | 0 refills | Status: DC

## 2021-06-01 MED ORDER — BENZONATATE 100 MG PO CAPS: 100.0000 mg | ORAL_CAPSULE | Freq: Three times a day (TID) | ORAL | 0 refills | Status: DC

## 2021-06-01 NOTE — ED Triage Notes (Signed)
Pt presents with cough, congestion, bilateral ear pain and ST x 5 days  ?

## 2021-06-01 NOTE — Discharge Instructions (Addendum)
Being caused by a virus meaning they must resolve on their own, it may take up to 7 to 10 days before you begin to see a turnaround, please be patient, your cough is most likely being exacerbated by your asthma, we will work to reduce any inflammation to the upper airways ? ?Begin use of prednisone every morning with food for 5 days to help reduce inflammation  ? ?May use albuterol inhaler taking 2 puffs every 4 hours as needed to help calm shortness of breath and wheezing ? ?May use promethazine DM for coughing and additional comfort, be mindful this medication may make you drowsy ? ?For worsening signs of breathing please go to the nearest emergency department for evaluation ? ?In addition: ? ?Maintaining adequate hydration may help to thin secretions and soothe the respiratory mucosa  ? ?Warm Liquids- Ingestion of warm liquids may have a soothing effect on the respiratory mucosa, increase the flow of nasal mucus, and loosen respiratory secretions, making them easier to remove ? ?May try honey (2.5 to 5 mL [0.5 to 1 teaspoon]) can be given straight or diluted in liquid (juice). Corn syrup may be substituted if honey is not available.    ?

## 2021-06-01 NOTE — ED Provider Notes (Addendum)
?[facility] ? ? ? ?CSN: [id number] ?Arrival date & time: 06/01/21  3403 ? ? ?  ? ?History   ?Chief Complaint ?Chief Complaint  ?Patient presents with  ? Cough  ? Sore Throat  ? Nasal Congestion  ? Otalgia  ? Fever  ? ? ?HPI ?Crystal Hardin is a 71 y.o. female.  ? ?Patient presents with fever, nasal congestion, bilateral ear pain, rhinorrhea, scratchy throat, nonproductive cough and wheezing for 5 days.  Cough interfering with sleep.  Wheezing predominantly heard at nighttime.  Known sick contacts prior to illness.  Decreased appetite but tolerating fluids.  Has attempted use of Allegra cold and flu, antihistamine which has been ineffective.  History of asthma and seasonal allergies. ? ?Past Medical History:  ?Diagnosis Date  ? Allergic rhinitis   ? GERD (gastroesophageal reflux disease)   ? Gout   ? Hyperlipidemia   ? Hypertension   ? Osteoarthritis   ? ? ?Patient Active Problem List  ? Diagnosis Date Noted  ? Chronic right-sided back pain 05/20/2021  ? Strain of lumbar region 12/06/2018  ? Prediabetes 03/14/2018  ? Encounter for annual general medical examination with abnormal findings in adult 03/14/2018  ? Polyarthralgia 07/18/2013  ? OBESITY 12/19/2007  ? ESOPHAGITIS, REFLUX 12/19/2007  ? Allergic rhinitis 01/01/2007  ? HYPERCHOLESTEROLEMIA 12/15/2006  ? Essential hypertension 12/04/2006  ? ? ?Past Surgical History:  ?Procedure Laterality Date  ? KNEE SURGERY Right   ? VAGINAL HYSTERECTOMY    ? left ovary intact, DUB  ? ? ?OB History   ?No obstetric history on file. ?  ? ? ? ?Home Medications   ? ?Prior to Admission medications   ?Medication Sig Start Date End Date Taking? Authorizing Provider  ?amLODipine (NORVASC) 5 MG tablet Take 1 tablet (5 mg total) by mouth daily. for blood pressure. 05/24/21  Yes Doreene Nest, NP  ?atorvastatin (LIPITOR) 20 MG tablet TAKE 1 TABLET BY MOUTH EVERY DAY FOR CHOLESTEROL. OFFICE VISIT REQUIRED FOR FURTHER REFILLS. 05/14/21  Yes Doreene Nest, NP   ?benzonatate (TESSALON) 100 MG capsule Take 1 capsule (100 mg total) by mouth every 8 (eight) hours. 06/01/21  Yes Eloni Darius, Elita Boone, NP  ?levocetirizine (XYZAL) 5 MG tablet Take 1 tablet (5 mg total) by mouth every evening. For allergies. Office visit required for further refills. 04/07/21  Yes Doreene Nest, NP  ?meloxicam (MOBIC) 7.5 MG tablet TAKE 1 TABLET (7.5 MG TOTAL) BY MOUTH 2 (TWO) TIMES DAILY AS NEEDED FOR PAIN. 01/27/20  Yes Doreene Nest, NP  ?montelukast (SINGULAIR) 10 MG tablet TAKE 1 TABLET BY MOUTH ONCE DAILY FOR ALLERGIES. OFFICE VISIT REQUIRED FOR FURTHER REFILLS. 05/14/21  Yes Doreene Nest, NP  ?predniSONE (DELTASONE) 20 MG tablet Take 2 tablets (40 mg total) by mouth daily. 06/01/21  Yes Elza Varricchio, Elita Boone, NP  ?promethazine-dextromethorphan (PROMETHAZINE-DM) 6.25-15 MG/5ML syrup Take 5 mLs by mouth 4 (four) times daily as needed for cough. 06/01/21  Yes Valinda Hoar, NP  ? ? ?Family History ?Family History  ?Problem Relation Age of Onset  ? Hyperlipidemia Mother   ? Hypertension Mother   ? Cancer Mother   ?     breast  ? Arthritis Mother   ? Heart disease Father   ? Emphysema Father   ? Hypertension Sister   ? Hypertension Brother   ? Dementia Maternal Aunt   ?     Alzheimers  ? Dementia Maternal Aunt   ?     Alzheimers  ? ? ?  Social History ?Social History  ? ?Tobacco Use  ? Smoking status: Never  ? Smokeless tobacco: Never  ?Vaping Use  ? Vaping Use: Never used  ?Substance Use Topics  ? Alcohol use: Not Currently  ?  Comment: rarely  ? Drug use: No  ? ? ? ?Allergies   ?Patient has no known allergies. ? ? ?Review of Systems ?Review of Systems  ?Constitutional:  Positive for appetite change, fatigue and fever. Negative for activity change, chills, diaphoresis and unexpected weight change.  ?HENT:  Positive for congestion, ear pain, postnasal drip and sore throat. Negative for dental problem, drooling, ear discharge, facial swelling, hearing loss, mouth sores, nosebleeds,  rhinorrhea, sinus pressure, sinus pain, sneezing, tinnitus, trouble swallowing and voice change.   ?Respiratory:  Positive for cough and wheezing. Negative for apnea, choking, chest tightness, shortness of breath and stridor.   ?Cardiovascular: Negative.   ?Gastrointestinal: Negative.   ?Neurological: Negative.   ? ? ?Physical Exam ?Triage Vital Signs ?ED Triage Vitals [06/01/21 0815]  ?Enc Vitals Group  ?   BP 131/85  ?   Pulse Rate (!) 111  ?   Resp 18  ?   Temp 98.7 ?F (37.1 ?C)  ?   Temp Source Oral  ?   SpO2 95 %  ?   Weight   ?   Height   ?   Head Circumference   ?   Peak Flow   ?   Pain Score   ?   Pain Loc   ?   Pain Edu?   ?   Excl. in GC?   ? ?No data found. ? ?Updated Vital Signs ?BP 131/85 (BP Location: Left Arm)   Pulse (!) 111   Temp 98.7 ?F (37.1 ?C) (Oral)   Resp 18   SpO2 95%  ? ?Visual Acuity ?Right Eye Distance:   ?Left Eye Distance:   ?Bilateral Distance:   ? ?Right Eye Near:   ?Left Eye Near:    ?Bilateral Near:    ? ?Physical Exam ?Constitutional:   ?   Appearance: Normal appearance. She is well-developed.  ?HENT:  ?   Head: Normocephalic.  ?   Right Ear: Tympanic membrane and ear canal normal.  ?   Left Ear: Tympanic membrane and ear canal normal.  ?   Nose: Congestion present. No rhinorrhea.  ?   Mouth/Throat:  ?   Mouth: Mucous membranes are moist.  ?   Pharynx: Posterior oropharyngeal erythema present.  ?   Tonsils: No tonsillar exudate. 0 on the right. 0 on the left.  ?Eyes:  ?   Extraocular Movements: Extraocular movements intact.  ?Cardiovascular:  ?   Rate and Rhythm: Normal rate and regular rhythm.  ?   Pulses: Normal pulses.  ?   Heart sounds: Normal heart sounds.  ?Pulmonary:  ?   Effort: Pulmonary effort is normal.  ?   Breath sounds: Normal breath sounds.  ?Musculoskeletal:  ?   Cervical back: Normal range of motion and neck supple.  ?Skin: ?   General: Skin is warm and dry.  ?Neurological:  ?   General: No focal deficit present.  ?   Mental Status: She is alert and oriented to  person, place, and time.  ?Psychiatric:     ?   Mood and Affect: Mood normal.     ?   Behavior: Behavior normal.  ? ? ? ?UC Treatments / Results  ?Labs ?(all labs ordered are listed, but only abnormal results are  displayed) ?Labs Reviewed - No data to display ? ?EKG ? ? ?Radiology ?No results found. ? ?Procedures ?Procedures (including critical care time) ? ?Medications Ordered in UC ?Medications - No data to display ? ?Initial Impression / Assessment and Plan / UC Course  ?I have reviewed the triage vital signs and the nursing notes. ? ?Pertinent labs & imaging results that were available during my care of the patient were reviewed by me and considered in my medical decision making (see chart for details). ? ?Viral URI with cough ? ?Vital signs are stable, O2 saturation 95% on room air, lungs clear to auscultation, stable for outpatient treatment, low suspicion for pneumonia, bronchitis or pneumothorax, will defer imaging today, etiology is symptoms are most likely viral as sick contacts are known, cough and wheezing are most likely exacerbated by asthma, discussed with patient, will move forward with treatment for upper respiratory inflammation, prednisone 40 mg burst prescribed as well as Tessalon and Promethazine DM for management of coughing, may continue use of over-the-counter medication for additional supportive care, may follow-up with urgent care as needed for worsening symptoms ?Final Clinical Impressions(s) / UC Diagnoses  ? ?Final diagnoses:  ?Viral URI with cough  ? ? ? ?Discharge Instructions   ? ?  ?Being caused by a virus meaning they must resolve on their own, it may take up to 7 to 10 days before you begin to see a turnaround, please be patient, your cough is most likely being exacerbated by your asthma, we will work to reduce any inflammation to the upper airways ? ?Begin use of prednisone every morning with food for 5 days to help reduce inflammation  ? ?May use albuterol inhaler taking 2 puffs  every 4 hours as needed to help calm shortness of breath and wheezing ? ?May use promethazine DM for coughing and additional comfort, be mindful this medication may make you drowsy ? ?For worsening signs of breathing please g

## 2021-06-07 ENCOUNTER — Encounter: Payer: Self-pay | Admitting: Internal Medicine

## 2021-06-07 ENCOUNTER — Ambulatory Visit: Payer: Medicare HMO

## 2021-06-07 ENCOUNTER — Ambulatory Visit (INDEPENDENT_AMBULATORY_CARE_PROVIDER_SITE_OTHER)
Admission: RE | Admit: 2021-06-07 | Discharge: 2021-06-07 | Disposition: A | Discharge status: Home / Self Care | Payer: Medicare HMO | Source: Ambulatory Visit | Attending: Internal Medicine | Admitting: Internal Medicine

## 2021-06-07 ENCOUNTER — Ambulatory Visit (INDEPENDENT_AMBULATORY_CARE_PROVIDER_SITE_OTHER): Payer: Medicare HMO | Admitting: Internal Medicine

## 2021-06-07 VITALS — BP 116/74 | HR 90 | Temp 97.0°F | Ht 63.5 in | Wt 176.0 lb

## 2021-06-07 DIAGNOSIS — R059 Cough, unspecified: Secondary | ICD-10-CM | POA: Insufficient documentation

## 2021-06-07 DIAGNOSIS — R051 Acute cough: Secondary | ICD-10-CM | POA: Diagnosis not present

## 2021-06-07 MED ORDER — AMOXICILLIN-POT CLAVULANATE 875-125 MG PO TABS: 1.0000 | ORAL_TABLET | Freq: Two times a day (BID) | ORAL | 0 refills | Status: DC

## 2021-06-07 MED ORDER — HYDROCODONE BIT-HOMATROP MBR 5-1.5 MG/5ML PO SOLN: 5.0000 mL | Freq: Every evening | ORAL | 0 refills | Status: DC | PRN

## 2021-06-07 NOTE — Progress Notes (Signed)
? ?Subjective:  ? ? Patient ID: Crystal Hardin, female    DOB: 10/18/1950, 71 y.o.   MRN: [id number] ? ?HPI ?Here due to persistent cough ? ?Started with illness about 2 weeks ago ?Watches 2 young grandkids---they have had pink eye, etc ?Has gotten worse over time ?She had bad sore throat, nasal drainage, cough which has worsened ? ?Went to urgent care last week--got prednisone and cough syrup ?Still not able to sleep ?Cough is terrible at night---vomits up phlegm sometimes ?Last 3-4 days, she gets spells where she is having trouble breathing ? ?Fever at first---low grade (none recently) ?Prednisone didn't seem to help ? ?Current Outpatient Medications on File Prior to Visit  ?Medication Sig Dispense Refill  ? amLODipine (NORVASC) 5 MG tablet Take 1 tablet (5 mg total) by mouth daily. for blood pressure. 90 tablet 3  ? atorvastatin (LIPITOR) 20 MG tablet TAKE 1 TABLET BY MOUTH EVERY DAY FOR CHOLESTEROL. OFFICE VISIT REQUIRED FOR FURTHER REFILLS. 30 tablet 0  ? benzonatate (TESSALON) 100 MG capsule Take 1 capsule (100 mg total) by mouth every 8 (eight) hours. 21 capsule 0  ? levocetirizine (XYZAL) 5 MG tablet TAKE 1 TABLET (5 MG TOTAL) BY MOUTH EVERY EVENING *NEED OFFICE VISIT FOR FURTHER REFILLS 60 tablet 0  ? meloxicam (MOBIC) 7.5 MG tablet TAKE 1 TABLET (7.5 MG TOTAL) BY MOUTH 2 (TWO) TIMES DAILY AS NEEDED FOR PAIN. 180 tablet 0  ? montelukast (SINGULAIR) 10 MG tablet TAKE 1 TABLET BY MOUTH ONCE DAILY FOR ALLERGIES. OFFICE VISIT REQUIRED FOR FURTHER REFILLS. 30 tablet 0  ? promethazine-dextromethorphan (PROMETHAZINE-DM) 6.25-15 MG/5ML syrup Take 5 mLs by mouth 4 (four) times daily as needed for cough. 118 mL 0  ? ?No current facility-administered medications on file prior to visit.  ? ? ?No Known Allergies ? ?Past Medical History:  ?Diagnosis Date  ? Allergic rhinitis   ? GERD (gastroesophageal reflux disease)   ? Gout   ? Hyperlipidemia   ? Hypertension   ? Osteoarthritis   ? ? ?Past Surgical History:   ?Procedure Laterality Date  ? KNEE SURGERY Right   ? VAGINAL HYSTERECTOMY    ? left ovary intact, DUB  ? ? ?Family History  ?Problem Relation Age of Onset  ? Hyperlipidemia Mother   ? Hypertension Mother   ? Cancer Mother   ?     breast  ? Arthritis Mother   ? Heart disease Father   ? Emphysema Father   ? Hypertension Sister   ? Hypertension Brother   ? Dementia Maternal Aunt   ?     Alzheimers  ? Dementia Maternal Aunt   ?     Alzheimers  ? ? ?Social History  ? ?Socioeconomic History  ? Marital status: Single  ?  Spouse name: Not on file  ? Number of children: 2  ? Years of education: Not on file  ? Highest education level: Not on file  ?Occupational History  ? Occupation: Print production planner  ?Tobacco Use  ? Smoking status: Never  ? Smokeless tobacco: Never  ?Vaping Use  ? Vaping Use: Never used  ?Substance and Sexual Activity  ? Alcohol use: Not Currently  ?  Comment: rarely  ? Drug use: No  ? Sexual activity: Not on file  ?Other Topics Concern  ? Not on file  ?Social History Narrative  ? Divorced  ? Print production planner  ? Two children, daughter and son  ? ?Social Determinants of Health  ? ?Financial Resource Strain: Low  Risk   ? Difficulty of Paying Living Expenses: Not hard at all  ?Food Insecurity: No Food Insecurity  ? Worried About Programme researcher, broadcasting/film/video in the Last Year: Never true  ? Ran Out of Food in the Last Year: Never true  ?Transportation Needs: No Transportation Needs  ? Lack of Transportation (Medical): No  ? Lack of Transportation (Non-Medical): No  ?Physical Activity: Sufficiently Active  ? Days of Exercise per Week: 7 days  ? Minutes of Exercise per Session: 150+ min  ?Stress: No Stress Concern Present  ? Feeling of Stress : Not at all  ?Social Connections: Socially Isolated  ? Frequency of Communication with Friends and Family: More than three times a week  ? Frequency of Social Gatherings with Friends and Family: More than three times a week  ? Attends Religious Services: Never  ? Active Member of Clubs  or Organizations: No  ? Attends Banker Meetings: Never  ? Marital Status: Never married  ?Intimate Partner Violence: Not At Risk  ? Fear of Current or Ex-Partner: No  ? Emotionally Abused: No  ? Physically Abused: No  ? Sexually Abused: No  ? ?Review of Systems ?Some ear pressure---total head pressure overall ?Prone to sinus infections--on singulair and xyzal nightly ?   ?Objective:  ? Physical Exam ?Constitutional:   ?   Appearance: Normal appearance.  ?HENT:  ?   Head:  ?   Comments: Mild maxillary tenderness ?   Right Ear: Tympanic membrane and ear canal normal.  ?   Left Ear: Tympanic membrane and ear canal normal.  ?   Nose:  ?   Comments: Moderate swelling and inflammation ?   Mouth/Throat:  ?   Pharynx: No oropharyngeal exudate or posterior oropharyngeal erythema.  ?Pulmonary:  ?   Effort: Pulmonary effort is normal.  ?   Breath sounds: Normal breath sounds. No wheezing or rales.  ?Musculoskeletal:  ?   Cervical back: Neck supple.  ?   Right lower leg: No edema.  ?   Left lower leg: No edema.  ?Lymphadenopathy:  ?   Cervical: No cervical adenopathy.  ?Neurological:  ?   Mental Status: She is alert.  ?  ? ? ? ? ?   ?Assessment & Plan:  ? ?

## 2021-06-07 NOTE — Assessment & Plan Note (Addendum)
With symptoms suggestive of sinusitis ?Keeping her up ?Has had some SOB in the past few days---so I will check a CXR ? ?CXR shows no pneumonia ?Will give augmentin 875 bid for 7 days ?Narcotic cough syrup for night ?

## 2021-06-08 ENCOUNTER — Other Ambulatory Visit: Payer: Self-pay | Admitting: Primary Care

## 2021-06-08 DIAGNOSIS — E7800 Pure hypercholesterolemia, unspecified: Secondary | ICD-10-CM

## 2021-06-08 DIAGNOSIS — J309 Allergic rhinitis, unspecified: Secondary | ICD-10-CM

## 2021-07-29 ENCOUNTER — Other Ambulatory Visit: Payer: Self-pay | Admitting: Family Medicine

## 2021-07-29 DIAGNOSIS — J309 Allergic rhinitis, unspecified: Secondary | ICD-10-CM

## 2021-08-02 ENCOUNTER — Encounter: Payer: Self-pay | Admitting: Nurse Practitioner

## 2021-08-02 ENCOUNTER — Telehealth: Payer: Self-pay | Admitting: Nurse Practitioner

## 2021-08-02 ENCOUNTER — Ambulatory Visit (INDEPENDENT_AMBULATORY_CARE_PROVIDER_SITE_OTHER): Payer: Medicare HMO | Admitting: Nurse Practitioner

## 2021-08-02 VITALS — BP 110/74 | HR 110 | Temp 98.8°F | Wt 175.6 lb

## 2021-08-02 DIAGNOSIS — A084 Viral intestinal infection, unspecified: Secondary | ICD-10-CM

## 2021-08-02 DIAGNOSIS — R197 Diarrhea, unspecified: Secondary | ICD-10-CM | POA: Diagnosis not present

## 2021-08-02 DIAGNOSIS — M2550 Pain in unspecified joint: Secondary | ICD-10-CM

## 2021-08-02 DIAGNOSIS — R61 Generalized hyperhidrosis: Secondary | ICD-10-CM | POA: Diagnosis not present

## 2021-08-02 HISTORY — DX: Viral intestinal infection, unspecified: A08.4

## 2021-08-02 LAB — CBC
HCT: 35.2 % — ABNORMAL LOW (ref 36.0–46.0)
Hemoglobin: 12.1 g/dL (ref 12.0–15.0)
MCHC: 34.4 g/dL (ref 30.0–36.0)
MCV: 86.3 fl (ref 78.0–100.0)
Platelets: 180 10*3/uL (ref 150.0–400.0)
RBC: 4.08 Mil/uL (ref 3.87–5.11)
RDW: 12.3 % (ref 11.5–15.5)
WBC: 4.9 10*3/uL (ref 4.0–10.5)

## 2021-08-02 LAB — BASIC METABOLIC PANEL
BUN: 11 mg/dL (ref 6–23)
CO2: 31 mEq/L (ref 19–32)
Calcium: 8.7 mg/dL (ref 8.4–10.5)
Chloride: 99 mEq/L (ref 96–112)
Creatinine, Ser: 0.67 mg/dL (ref 0.40–1.20)
GFR: 88.67 mL/min (ref >60.00)
Glucose, Bld: 107 mg/dL — ABNORMAL HIGH (ref 70–99)
Potassium: 3.6 mEq/L (ref 3.5–5.1)
Sodium: 139 mEq/L (ref 135–145)

## 2021-08-02 LAB — TSH: TSH: 1.48 u[IU]/mL (ref 0.35–5.50)

## 2021-08-02 NOTE — Assessment & Plan Note (Signed)
Patient is complaining of night sweats.  She is postmenopausal and started with onset of illness.  Patient did have a fever the first night but has not Had one since.  Recently had a chest x-ray a month and a half ago that was normal except for pulmonary nodule which has not changed in 20 years.  Defer chest x-ray for

## 2021-08-02 NOTE — Assessment & Plan Note (Signed)
3 episodes of diarrhea a day at least.  We will check basic labs to make sure no derangement of electrolytes, pending result

## 2021-08-04 NOTE — Telephone Encounter (Signed)
Is she requesting a refill of her meloxicam?  Not quite sure based on the message.

## 2021-08-04 NOTE — Telephone Encounter (Signed)
Sorry I was dictating. She states she had her CPE and did not get the meloxicam refilled at that time. Since she is getting 90 day supply I told her I would get her PCP to refill the medication

## 2021-08-05 MED ORDER — MELOXICAM 7.5 MG PO TABS: 7.5000 mg | ORAL_TABLET | Freq: Two times a day (BID) | ORAL | 0 refills | Status: DC | PRN

## 2021-08-05 NOTE — Telephone Encounter (Signed)
Refills sent to pharmacy. 

## 2021-08-05 NOTE — Addendum Note (Signed)
Addended by: Doreene Nest on: 08/05/2021 06:56 AM   Modules accepted: Orders

## 2021-11-03 ENCOUNTER — Other Ambulatory Visit: Payer: Self-pay | Admitting: Primary Care

## 2021-11-03 DIAGNOSIS — M2550 Pain in unspecified joint: Secondary | ICD-10-CM

## 2021-11-13 ENCOUNTER — Encounter (HOSPITAL_BASED_OUTPATIENT_CLINIC_OR_DEPARTMENT_OTHER): Payer: Self-pay

## 2021-11-13 ENCOUNTER — Other Ambulatory Visit: Payer: Self-pay

## 2021-11-13 ENCOUNTER — Emergency Department (HOSPITAL_BASED_OUTPATIENT_CLINIC_OR_DEPARTMENT_OTHER)
Admission: EM | Admit: 2021-11-13 | Discharge: 2021-11-13 | Disposition: A | Discharge status: Home / Self Care | Payer: Medicare HMO | Attending: Emergency Medicine | Admitting: Emergency Medicine

## 2021-11-13 ENCOUNTER — Emergency Department (HOSPITAL_BASED_OUTPATIENT_CLINIC_OR_DEPARTMENT_OTHER): Payer: Medicare HMO

## 2021-11-13 DIAGNOSIS — B9789 Other viral agents as the cause of diseases classified elsewhere: Secondary | ICD-10-CM | POA: Diagnosis not present

## 2021-11-13 DIAGNOSIS — I10 Essential (primary) hypertension: Secondary | ICD-10-CM | POA: Insufficient documentation

## 2021-11-13 DIAGNOSIS — R0789 Other chest pain: Secondary | ICD-10-CM | POA: Insufficient documentation

## 2021-11-13 DIAGNOSIS — Z20822 Contact with and (suspected) exposure to covid-19: Secondary | ICD-10-CM | POA: Diagnosis not present

## 2021-11-13 DIAGNOSIS — R509 Fever, unspecified: Secondary | ICD-10-CM | POA: Diagnosis not present

## 2021-11-13 DIAGNOSIS — Z79899 Other long term (current) drug therapy: Secondary | ICD-10-CM | POA: Insufficient documentation

## 2021-11-13 DIAGNOSIS — J069 Acute upper respiratory infection, unspecified: Secondary | ICD-10-CM | POA: Insufficient documentation

## 2021-11-13 DIAGNOSIS — R059 Cough, unspecified: Secondary | ICD-10-CM | POA: Diagnosis not present

## 2021-11-13 LAB — RESP PANEL BY RT-PCR (FLU A&B, COVID) ARPGX2
Influenza A by PCR: NEGATIVE
Influenza B by PCR: NEGATIVE
SARS Coronavirus 2 by RT PCR: NEGATIVE

## 2021-11-13 MED ORDER — HYDROCOD POLI-CHLORPHE POLI ER 10-8 MG/5ML PO SUER: 5.0000 mL | Freq: Two times a day (BID) | ORAL | 0 refills | Status: DC | PRN

## 2021-11-13 MED ORDER — KETOROLAC TROMETHAMINE 30 MG/ML IJ SOLN
30.0000 mg | Freq: Once | INTRAMUSCULAR | Status: AC
  Administered 2021-11-13: 30 mg via INTRAMUSCULAR
  Filled 2021-11-13: qty 1

## 2021-11-13 NOTE — ED Provider Notes (Signed)
[facility] Provider Note   CSN: [id number] Arrival date & time: 11/13/21  1100     History  Chief Complaint  Patient presents with   Cough    Crystal Hardin is a 71 y.o. female.  Pt is a 71 yo female with pmhx significant for htn, gout, hld, oa, gerd, and allergies.  Pt said she watches her grand kids who are school age.  They came home with runny noses and coughs a few days ago.  Pt has had a cough for 3 days.  She has pain in the front of her chest when she coughs.  She has body aches and fatigue.  She has pain in her left ear and in her throat.  She denies fevers.  She took a home covid test today which was negative.       Home Medications Prior to Admission medications   Medication Sig Start Date End Date Taking? Authorizing Provider  chlorpheniramine-HYDROcodone (TUSSIONEX) 10-8 MG/5ML Take 5 mLs by mouth every 12 (twelve) hours as needed for cough. 11/13/21  Yes Isla Pence, MD  meloxicam (MOBIC) 7.5 MG tablet TAKE 1 TABLET BY MOUTH 2 TIMES DAILY AS NEEDED FOR PAIN. 11/03/21   Pleas Koch, NP  amLODipine (NORVASC) 5 MG tablet Take 1 tablet (5 mg total) by mouth daily. for blood pressure. 05/24/21   Pleas Koch, NP  atorvastatin (LIPITOR) 20 MG tablet TAKE 1 TABLET BY MOUTH EVERY DAY FOR CHOLESTEROL. 06/09/21   Pleas Koch, NP  levocetirizine (XYZAL) 5 MG tablet Take 1 tablet (5 mg total) by mouth every evening. For allergies. 07/29/21   Pleas Koch, NP  montelukast (SINGULAIR) 10 MG tablet Take 1 tablet by mouth once daily for allergies. 06/09/21   Pleas Koch, NP      Allergies    Patient has no known allergies.    Review of Systems   Review of Systems  HENT:  Positive for ear pain and sore throat.   Respiratory:  Positive for cough.   Cardiovascular:  Positive for chest pain.  All other systems reviewed and are negative.   Physical Exam Updated Vital Signs BP (!) 127/93 (BP Location: Right Arm)    Pulse 94   Temp 98.4 F (36.9 C) (Oral)   Resp 18   Ht 5' 3.5" (1.613 m)   Wt 79.8 kg   SpO2 95%   BMI 30.69 kg/m  Physical Exam Vitals and nursing note reviewed.  Constitutional:      Appearance: Normal appearance.  HENT:     Head: Normocephalic and atraumatic.     Right Ear: External ear normal.     Left Ear: External ear normal.     Nose: Nose normal.     Mouth/Throat:     Mouth: Mucous membranes are moist.     Pharynx: Oropharynx is clear.     Comments: Clear fluid behind left ear Eyes:     Extraocular Movements: Extraocular movements intact.     Conjunctiva/sclera: Conjunctivae normal.     Pupils: Pupils are equal, round, and reactive to light.  Cardiovascular:     Rate and Rhythm: Normal rate and regular rhythm.     Pulses: Normal pulses.     Heart sounds: Normal heart sounds.  Pulmonary:     Effort: Pulmonary effort is normal.     Breath sounds: Normal breath sounds.  Abdominal:     General: Abdomen is flat. Bowel sounds are normal.  Palpations: Abdomen is soft.  Musculoskeletal:        General: Normal range of motion.     Cervical back: Normal range of motion and neck supple.  Skin:    General: Skin is warm.     Capillary Refill: Capillary refill takes less than 2 seconds.  Neurological:     General: No focal deficit present.     Mental Status: She is alert and oriented to person, place, and time.  Psychiatric:        Mood and Affect: Mood normal.        Behavior: Behavior normal.     ED Results / Procedures / Treatments   Labs (all labs ordered are listed, but only abnormal results are displayed) Labs Reviewed  RESP PANEL BY RT-PCR (FLU A&B, COVID) [id number]    EKG None  Radiology DG Chest Portable 1 View  Result Date: 11/13/2021 CLINICAL DATA:  Cough EXAM: PORTABLE CHEST 1 VIEW COMPARISON:  Chest x-ray Jun 07, 2021 FINDINGS: The heart size and mediastinal contours are within normal limits. Both lungs are clear. The visualized skeletal  structures are unremarkable. IMPRESSION: No acute cardiopulmonary abnormality. Electronically Signed   By: Beryle Flock M.D.   On: 11/13/2021 15:15    Procedures Procedures    Medications Ordered in ED Medications  ketorolac (TORADOL) 30 MG/ML injection 30 mg (30 mg Intramuscular Given 11/13/21 1541)    ED Course/ Medical Decision Making/ A&P                           Medical Decision Making Amount and/or Complexity of Data Reviewed Radiology: ordered.  Risk Prescription drug management.   This patient presents to the ED for concern of cough, this involves an extensive number of treatment options, and is a complaint that carries with it a high risk of complications and morbidity.  The differential diagnosis includes pna, uri, covid/flu   Co morbidities that complicate the patient evaluation  htn, gout, hld, oa, gerd, and allergies   Additional history obtained:  Additional history obtained from epic chart review    Lab Tests:  I Ordered, and personally interpreted labs.  The pertinent results include:  covid/flu neg   Imaging Studies ordered:  I ordered imaging studies including cxr  I independently visualized and interpreted imaging which showed  IMPRESSION:  No acute cardiopulmonary abnormality.   I agree with the radiologist interpretation   Cardiac Monitoring:  The patient was maintained on a cardiac monitor.  I personally viewed and interpreted the cardiac monitored which showed an underlying rhythm of: nsr   Medicines ordered and prescription drug management:  I ordered medication including toradol  for pain  Reevaluation of the patient after these medicines showed that the patient improved I have reviewed the patients home medicines and have made adjustments as needed    Problem List / ED Course:  Cough:  sx sound viral.  Pt has no evidence of pna and no fever here.  Covid/flu neg. She is d/c with tussionex.  She is to return if worse.  F/u  with pcp.   Reevaluation:  After the interventions noted above, I reevaluated the patient and found that they have :improved   Social Determinants of Health:  Lives at home   Dispostion:  After consideration of the diagnostic results and the patients response to treatment, I feel that the patent would benefit from discharge with outpatient f/u.  Final Clinical Impression(s) / ED Diagnoses Final diagnoses:  Viral upper respiratory tract infection  Chest wall pain    Rx / DC Orders ED Discharge Orders          Ordered    chlorpheniramine-HYDROcodone (TUSSIONEX) 10-8 MG/5ML  Every 12 hours PRN        11/13/21 1649              Isla Pence, MD 11/13/21 1716

## 2021-11-13 NOTE — ED Triage Notes (Signed)
Pt arrives POV from home with cough approximately 3 weeks which has gotten worse over the last 3 days.  She says she had bronchitis in early August, and had a cough at that time, bronchitis resolved.  Did home Covid test this morning which was negative.  Reports body aches, fatigue, left ear ache, denies fever, but has felt "hot and cold", felt clammy and weak when she got up this morning.  Ambulatory to triage, in NAD.

## 2021-11-18 ENCOUNTER — Telehealth: Payer: Self-pay

## 2021-11-18 NOTE — Telephone Encounter (Signed)
     Patient  visit on 10/7  at [facility]   Have you been able to follow up with your primary care physician?  yes The patient was or was not able to obtain any needed medicine or equipment. yes  Are there diet recommendations that you are having difficulty following?  na Patient expresses understanding of discharge instructions and education provided has no other needs at this time. yes     Crystal Hardin Pop [facility] Care Guide, [facility], Care Management  [phone] [address], [place], [place] [address] Phone: [phone] Email: [email or web address]    

## 2022-01-31 ENCOUNTER — Other Ambulatory Visit: Payer: Self-pay | Admitting: Primary Care

## 2022-01-31 DIAGNOSIS — M2550 Pain in unspecified joint: Secondary | ICD-10-CM

## 2022-03-14 ENCOUNTER — Ambulatory Visit (INDEPENDENT_AMBULATORY_CARE_PROVIDER_SITE_OTHER): Payer: Medicare HMO

## 2022-03-14 VITALS — Ht 63.5 in | Wt 176.0 lb

## 2022-03-14 DIAGNOSIS — Z0000 Encounter for general adult medical examination without abnormal findings: Secondary | ICD-10-CM

## 2022-03-14 NOTE — Patient Instructions (Addendum)
Crystal Hardin , Thank you for taking time to come for your Medicare Wellness Visit. I appreciate your ongoing commitment to your health goals. Please review the following plan we discussed and let me know if I can assist you in the future.   These are the goals we discussed:  Goals Addressed               This Visit's Progress     Patient Stated     Increase physical activity (pt-stated)        Walk more in the months to come Weight goal 165lb Weight Watchers         This is a list of the screening recommended for you and due dates:  Health Maintenance  Topic Date Due   Mammogram  06/22/2012   DEXA scan (bone density measurement)  Never done   COVID-19 Vaccine (3 - 2023-24 season) 03/30/2022*   Flu Shot  05/08/2022*   Medicare Annual Wellness Visit  03/15/2023   Cologuard (Stool DNA test)  04/23/2024   DTaP/Tdap/Td vaccine (4 - Td or Tdap) 11/10/2029   Pneumonia Vaccine  Completed   Hepatitis C Screening: USPSTF Recommendation to screen - Ages 87-79 yo.  Completed   Zoster (Shingles) Vaccine  Completed   HPV Vaccine  Aged Out  *Topic was postponed. The date shown is not the original due date.   Conditions/risks identified: none new.  Next appointment: Follow up in one year for your annual wellness visit    Preventive Care 65 Years and Older, Female Preventive care refers to lifestyle choices and visits with your health care provider that can promote health and wellness. What does preventive care include? A yearly physical exam. This is also called an annual well check. Dental exams once or twice a year. Routine eye exams. Ask your health care provider how often you should have your eyes checked. Personal lifestyle choices, including: Daily care of your teeth and gums. Regular physical activity. Eating a healthy diet. Avoiding tobacco and drug use. Limiting alcohol use. Practicing safe sex. Taking low-dose aspirin every day. Taking vitamin and mineral supplements  as recommended by your health care provider. What happens during an annual well check? The services and screenings done by your health care provider during your annual well check will depend on your age, overall health, lifestyle risk factors, and family history of disease. Counseling  Your health care provider may ask you questions about your: Alcohol use. Tobacco use. Drug use. Emotional well-being. Home and relationship well-being. Sexual activity. Eating habits. History of falls. Memory and ability to understand (cognition). Work and work Statistician. Reproductive health. Screening  You may have the following tests or measurements: Height, weight, and BMI. Blood pressure. Lipid and cholesterol levels. These may be checked every 5 years, or more frequently if you are over 64 years old. Skin check. Lung cancer screening. You may have this screening every year starting at age 10 if you have a 30-pack-year history of smoking and currently smoke or have quit within the past 15 years. Fecal occult blood test (FOBT) of the stool. You may have this test every year starting at age 61. Flexible sigmoidoscopy or colonoscopy. You may have a sigmoidoscopy every 5 years or a colonoscopy every 10 years starting at age 38. Hepatitis C blood test. Hepatitis B blood test. Sexually transmitted disease (STD) testing. Diabetes screening. This is done by checking your blood sugar (glucose) after you have not eaten for a while (fasting). You may have  this done every 1-3 years. Bone density scan. This is done to screen for osteoporosis. You may have this done starting at age 19. Mammogram. This may be done every 1-2 years. Talk to your health care provider about how often you should have regular mammograms. Talk with your health care provider about your test results, treatment options, and if necessary, the need for more tests. Vaccines  Your health care provider may recommend certain vaccines, such  as: Influenza vaccine. This is recommended every year. Tetanus, diphtheria, and acellular pertussis (Tdap, Td) vaccine. You may need a Td booster every 10 years. Zoster vaccine. You may need this after age 61. Pneumococcal 13-valent conjugate (PCV13) vaccine. One dose is recommended after age 16. Pneumococcal polysaccharide (PPSV23) vaccine. One dose is recommended after age 8. Talk to your health care provider about which screenings and vaccines you need and how often you need them. This information is not intended to replace advice given to you by your health care provider. Make sure you discuss any questions you have with your health care provider. Document Released: 02/20/2015 Document Revised: 10/14/2015 Document Reviewed: 11/25/2014 Elsevier Interactive Patient Education  2017 [place] Prevention in the Home Falls can cause injuries. They can happen to people of all ages. There are many things you can do to make your home safe and to help prevent falls. What can I do on the outside of my home? Regularly fix the edges of walkways and driveways and fix any cracks. Remove anything that might make you trip as you walk through a door, such as a raised step or threshold. Trim any bushes or trees on the path to your home. Use bright outdoor lighting. Clear any walking paths of anything that might make someone trip, such as rocks or tools. Regularly check to see if handrails are loose or broken. Make sure that both sides of any steps have handrails. Any raised decks and porches should have guardrails on the edges. Have any leaves, snow, or ice cleared regularly. Use sand or salt on walking paths during winter. Clean up any spills in your garage right away. This includes oil or grease spills. What can I do in the bathroom? Use night lights. Install grab bars by the toilet and in the tub and shower. Do not use towel bars as grab bars. Use non-skid mats or decals in the tub or  shower. If you need to sit down in the shower, use a plastic, non-slip stool. Keep the floor dry. Clean up any water that spills on the floor as soon as it happens. Remove soap buildup in the tub or shower regularly. Attach bath mats securely with double-sided non-slip rug tape. Do not have throw rugs and other things on the floor that can make you trip. What can I do in the bedroom? Use night lights. Make sure that you have a light by your bed that is easy to reach. Do not use any sheets or blankets that are too big for your bed. They should not hang down onto the floor. Have a firm chair that has side arms. You can use this for support while you get dressed. Do not have throw rugs and other things on the floor that can make you trip. What can I do in the kitchen? Clean up any spills right away. Avoid walking on wet floors. Keep items that you use a lot in easy-to-reach places. If you need to reach something above you, use a strong step stool  that has a grab bar. Keep electrical cords out of the way. Do not use floor polish or wax that makes floors slippery. If you must use wax, use non-skid floor wax. Do not have throw rugs and other things on the floor that can make you trip. What can I do with my stairs? Do not leave any items on the stairs. Make sure that there are handrails on both sides of the stairs and use them. Fix handrails that are broken or loose. Make sure that handrails are as long as the stairways. Check any carpeting to make sure that it is firmly attached to the stairs. Fix any carpet that is loose or worn. Avoid having throw rugs at the top or bottom of the stairs. If you do have throw rugs, attach them to the floor with carpet tape. Make sure that you have a light switch at the top of the stairs and the bottom of the stairs. If you do not have them, ask someone to add them for you. What else can I do to help prevent falls? Wear shoes that: Do not have high heels. Have  rubber bottoms. Are comfortable and fit you well. Are closed at the toe. Do not wear sandals. If you use a stepladder: Make sure that it is fully opened. Do not climb a closed stepladder. Make sure that both sides of the stepladder are locked into place. Ask someone to hold it for you, if possible. Clearly mark and make sure that you can see: Any grab bars or handrails. First and last steps. Where the edge of each step is. Use tools that help you move around (mobility aids) if they are needed. These include: Canes. Walkers. Scooters. Crutches. Turn on the lights when you go into a dark area. Replace any light bulbs as soon as they burn out. Set up your furniture so you have a clear path. Avoid moving your furniture around. If any of your floors are uneven, fix them. If there are any pets around you, be aware of where they are. Review your medicines with your doctor. Some medicines can make you feel dizzy. This can increase your chance of falling. Ask your doctor what other things that you can do to help prevent falls. This information is not intended to replace advice given to you by your health care provider. Make sure you discuss any questions you have with your health care provider. Document Released: 11/20/2008 Document Revised: 07/02/2015 Document Reviewed: 02/28/2014 Elsevier Interactive Patient Education  2017 [employer].

## 2022-03-14 NOTE — Progress Notes (Signed)
Subjective:   Crystal Hardin is a 72 y.o. female who presents for Medicare Annual (Subsequent) preventive examination.  Review of Systems    No ROS.  Medicare Wellness Virtual Visit.  Visual/audio telehealth visit, UTA vital signs.   See social history for additional risk factors.   Cardiac Risk Factors include: advanced age (>36men, >52 women)     Objective:    Today's Vitals   03/14/22 1020  Weight: 176 lb (79.8 kg)  Height: 5' 3.5" (1.613 m)   Body mass index is 30.69 kg/m.     03/14/2022   10:27 AM 11/13/2021   11:50 AM 03/12/2021   12:01 PM 03/11/2020   12:01 PM 03/04/2019    2:05 PM 02/28/2018    3:54 PM 06/06/2016   11:56 PM  Advanced Directives  Does Patient Have a Medical Advance Directive? No No No No No No No  Would patient like information on creating a medical advance directive? No - Patient declined  Yes (MAU/Ambulatory/Procedural Areas - Information given) No - Patient declined Yes (MAU/Ambulatory/Procedural Areas - Information given) No - Patient declined No - Patient declined    Current Medications (verified) Outpatient Encounter Medications as of 03/14/2022  Medication Sig   amLODipine (NORVASC) 5 MG tablet Take 1 tablet (5 mg total) by mouth daily. for blood pressure.   atorvastatin (LIPITOR) 20 MG tablet TAKE 1 TABLET BY MOUTH EVERY DAY FOR CHOLESTEROL.   chlorpheniramine-HYDROcodone (TUSSIONEX) 10-8 MG/5ML Take 5 mLs by mouth every 12 (twelve) hours as needed for cough.   levocetirizine (XYZAL) 5 MG tablet Take 1 tablet (5 mg total) by mouth every evening. For allergies.   meloxicam (MOBIC) 7.5 MG tablet TAKE 1 TABLET BY MOUTH TWICE A DAY AS NEEDED FOR PAIN   montelukast (SINGULAIR) 10 MG tablet Take 1 tablet by mouth once daily for allergies.   No facility-administered encounter medications on file as of 03/14/2022.    Allergies (verified) Patient has no known allergies.   History: Past Medical History:  Diagnosis Date   Allergic rhinitis    GERD  (gastroesophageal reflux disease)    Gout    Hyperlipidemia    Hypertension    Osteoarthritis    Past Surgical History:  Procedure Laterality Date   KNEE SURGERY Right    VAGINAL HYSTERECTOMY     left ovary intact, DUB   Family History  Problem Relation Age of Onset   Hyperlipidemia Mother    Hypertension Mother    Cancer Mother        breast   Arthritis Mother    Heart disease Father    Emphysema Father    Hypertension Sister    Hypertension Brother    Dementia Maternal Aunt        Alzheimers   Dementia Maternal Aunt        Alzheimers   Social History   Socioeconomic History   Marital status: Single    Spouse name: Not on file   Number of children: 2   Years of education: Not on file   Highest education level: Not on file  Occupational History   Occupation: Glass blower/designer  Tobacco Use   Smoking status: Never   Smokeless tobacco: Never  Vaping Use   Vaping Use: Never used  Substance and Sexual Activity   Alcohol use: Not Currently    Comment: rarely   Drug use: No   Sexual activity: Not on file  Other Topics Concern   Not on file  Social History  Narrative   Divorced   Glass blower/designer   Two children, daughter and son   Social Determinants of Health   Financial Resource Strain: Low Risk  (03/14/2022)   Overall Financial Resource Strain (CARDIA)    Difficulty of Paying Living Expenses: Not very hard  Food Insecurity: No Food Insecurity (03/14/2022)   Hunger Vital Sign    Worried About Running Out of Food in the Last Year: Never true    Ran Out of Food in the Last Year: Never true  Transportation Needs: No Transportation Needs (03/14/2022)   PRAPARE - Hydrologist (Medical): No    Lack of Transportation (Non-Medical): No  Physical Activity: Sufficiently Active (03/14/2022)   Exercise Vital Sign    Days of Exercise per Week: 5 days    Minutes of Exercise per Session: 150+ min  Stress: No Stress Concern Present (03/14/2022)   [place]    Feeling of Stress : Not at all  Social Connections: Unknown (03/14/2022)   Social Connection and Isolation Panel [NHANES]    Frequency of Communication with Friends and Family: More than three times a week    Frequency of Social Gatherings with Friends and Family: More than three times a week    Attends Religious Services: Not on Advertising copywriter or Organizations: No    Attends Archivist Meetings: Never    Marital Status: Divorced    Tobacco Counseling Counseling given: Not Answered   Clinical Intake:  Pre-visit preparation completed: Yes           How often do you need to have someone help you when you read instructions, pamphlets, or other written materials from your doctor or pharmacy?: 1 - Never    Interpreter Needed?: No      Activities of Daily Living    03/14/2022   10:22 AM 03/14/2022    8:14 AM  In your present state of health, do you have any difficulty performing the following activities:  Hearing? 0 0  Vision? 0 0  Difficulty concentrating or making decisions? 0 0  Walking or climbing stairs? 0 0  Dressing or bathing? 0 0  Doing errands, shopping? 0 0  Preparing Food and eating ? N N  Using the Toilet? N N  In the past six months, have you accidently leaked urine? N N  Do you have problems with loss of bowel control? N N  Managing your Medications? N N  Managing your Finances? N N  Housekeeping or managing your Housekeeping? N N    Patient Care Team: Pleas Koch, NP as PCP - General (Internal Medicine)  Indicate any recent Medical Services you may have received from other than [facility] providers in the past year (date may be approximate).     Assessment:   This is a routine wellness examination for Crystal Hardin.  I connected with  Crissie Figures on 03/14/22 by a audio enabled telemedicine application and verified that I am speaking with the correct  person using two identifiers.  Patient Location: Home  Provider Location: Office/Clinic  I discussed the limitations of evaluation and management by telemedicine. The patient expressed understanding and agreed to proceed.   Hearing/Vision screen Hearing Screening - Comments:: Patient is able to hear conversational tones without difficulty.  No issues reported.   Vision Screening - Comments:: Followed by [facility], [place] Wears glasses Last appt 2023  Dietary  issues and exercise activities discussed: Current Exercise Habits: Home exercise routine, Type of exercise: walking, Intensity: Mild   Goals Addressed               This Visit's Progress     Patient Stated     Increase physical activity (pt-stated)        Walk more in the months to come Weight goal 165lb Weight Watchers       Depression Screen    03/14/2022   10:26 AM 05/20/2021    2:23 PM 03/12/2021   12:04 PM 03/11/2020   12:03 PM 03/04/2019    2:07 PM 02/28/2018    3:47 PM  PHQ 2/9 Scores  PHQ - 2 Score 0 0 0 0 0 0  PHQ- 9 Score  0  0 0 0    Fall Risk    03/14/2022   10:22 AM 03/14/2022    8:14 AM 03/12/2021   12:03 PM 03/11/2020   12:02 PM 03/04/2019    2:05 PM  Fall Risk   Falls in the past year? 0 0 0 0 1  Comment     tripped and fell  Number falls in past yr: 0 0 0 0 0  Injury with Fall?  0 0 0 0  Risk for fall due to :   No Fall Risks No Fall Risks Medication side effect  Follow up Falls evaluation completed;Falls prevention discussed  Falls prevention discussed Falls evaluation completed;Falls prevention discussed Falls evaluation completed;Falls prevention discussed    FALL RISK PREVENTION PERTAINING TO THE HOME: Home free of loose throw rugs in walkways, pet beds, electrical cords, etc? Yes  Adequate lighting in your home to reduce risk of falls? Yes   ASSISTIVE DEVICES UTILIZED TO PREVENT FALLS: Life alert? No  Use of a cane, walker or w/c? No  Grab bars in the bathroom? No  Shower  chair or bench in shower? No  Elevated toilet seat or a handicapped toilet? No   TIMED UP AND GO: Was the test performed? No .   Cognitive Function:    03/11/2020   12:05 PM 03/04/2019    2:09 PM 02/28/2018    3:53 PM  MMSE - Mini Mental State Exam  Not completed: Refused    Orientation to time  5 5  Orientation to Place  5 5  Registration  3 3  Attention/ Calculation  5 0  Recall  3 3  Language- name 2 objects   0  Language- repeat  1 1  Language- follow 3 step command   3  Language- read & follow direction   0  Write a sentence   0  Copy design   0  Total score   20        03/14/2022   10:23 AM  6CIT Screen  What Year? 0 points  What month? 0 points  What time? 0 points  Count back from 20 0 points  Months in reverse 0 points  Repeat phrase 0 points  Total Score 0 points    Immunizations Immunization History  Administered Date(s) Administered   Fluad Quad(high Dose 65+) 03/18/2020   Influenza, High Dose Seasonal PF 11/08/2018   Influenza, Seasonal, Injecte, Preservative Fre 12/08/2015   Influenza,inj,Quad PF,6+ Mos 10/21/2013, 11/04/2014, 03/14/2018   PFIZER(Purple Top)SARS-COV-2 Vaccination 04/07/2019, 04/29/2019   Pneumococcal Conjugate-13 03/18/2019   Pneumococcal Polysaccharide-23 03/14/2018   Td 01/18/2006   Tdap 05/17/2017, 11/11/2019   Zoster Recombinat (Shingrix) 11/08/2018, 05/16/2019   Screening  Tests Health Maintenance  Topic Date Due   MAMMOGRAM  06/22/2012   DEXA SCAN  Never done   COVID-19 Vaccine (3 - 2023-24 season) 03/30/2022 (Originally 10/08/2021)   INFLUENZA VACCINE  05/08/2022 (Originally 09/07/2021)   Medicare Annual Wellness (AWV)  03/15/2023   Fecal DNA (Cologuard)  04/23/2024   DTaP/Tdap/Td (4 - Td or Tdap) 11/10/2029   Pneumonia Vaccine 49+ Years old  Completed   Hepatitis C Screening  Completed   Zoster Vaccines- Shingrix  Completed   HPV VACCINES  Aged Out   Health Maintenance Health Maintenance Due  Topic Date Due    MAMMOGRAM  06/22/2012   DEXA SCAN  Never done   Mammogram- agrees to schedule. Numbers provided for   Dexa Scan- agrees to schedule.   Lung Cancer Screening: (Low Dose CT Chest recommended if Age 71-80 years, 30 pack-year currently smoking OR have quit w/in 15years.) does not qualify.   Hepatitis C Screening: Completed 2020.  Vision Screening: Recommended annual ophthalmology exams for early detection of glaucoma and other disorders of the eye.  Dental Screening: Recommended annual dental exams for proper oral hygiene.  Community Resource Referral / Chronic Care Management: CRR required this visit?  No   CCM required this visit?  No      Plan:     I have personally reviewed and noted the following in the patient's chart:   Medical and social history Use of alcohol, tobacco or illicit drugs  Current medications and supplements including opioid prescriptions. Patient is not currently taking opioid prescriptions. Functional ability and status Nutritional status Physical activity Advanced directives List of other physicians Hospitalizations, surgeries, and ER visits in previous 12 months Vitals Screenings to include cognitive, depression, and falls Referrals and appointments  In addition, I have reviewed and discussed with patient certain preventive protocols, quality metrics, and best practice recommendations. A written personalized care plan for preventive services as well as general preventive health recommendations were provided to patient.     Leta Jungling, LPN   09/13/[id number]

## 2022-04-25 ENCOUNTER — Other Ambulatory Visit: Payer: Self-pay | Admitting: Primary Care

## 2022-04-25 DIAGNOSIS — J309 Allergic rhinitis, unspecified: Secondary | ICD-10-CM

## 2022-04-25 NOTE — Telephone Encounter (Signed)
Patient is due for CPE/follow up in late April, this will be required prior to any further refills.  Please schedule, thank you!   

## 2022-04-26 NOTE — Telephone Encounter (Signed)
Lvmtcb, sent mychart message  

## 2022-04-28 ENCOUNTER — Other Ambulatory Visit: Payer: Self-pay | Admitting: Primary Care

## 2022-04-28 DIAGNOSIS — Z1231 Encounter for screening mammogram for malignant neoplasm of breast: Secondary | ICD-10-CM

## 2022-04-29 ENCOUNTER — Other Ambulatory Visit: Payer: Self-pay | Admitting: Primary Care

## 2022-04-29 DIAGNOSIS — M2550 Pain in unspecified joint: Secondary | ICD-10-CM

## 2022-04-29 NOTE — Telephone Encounter (Signed)
Patient is due for CPE/follow up in mid April, this will be required prior to any further refills.  Please schedule, thank you!   

## 2022-04-29 NOTE — Telephone Encounter (Signed)
LVM for patient to cb & sched.

## 2022-04-29 NOTE — Telephone Encounter (Signed)
Patient has been scheduled

## 2022-05-04 ENCOUNTER — Other Ambulatory Visit: Payer: Self-pay | Admitting: Primary Care

## 2022-05-04 DIAGNOSIS — E2839 Other primary ovarian failure: Secondary | ICD-10-CM

## 2022-05-30 ENCOUNTER — Other Ambulatory Visit: Payer: Self-pay | Admitting: Primary Care

## 2022-05-30 DIAGNOSIS — I10 Essential (primary) hypertension: Secondary | ICD-10-CM

## 2022-06-07 ENCOUNTER — Other Ambulatory Visit: Payer: Self-pay | Admitting: Primary Care

## 2022-06-07 DIAGNOSIS — E7800 Pure hypercholesterolemia, unspecified: Secondary | ICD-10-CM

## 2022-06-07 DIAGNOSIS — J309 Allergic rhinitis, unspecified: Secondary | ICD-10-CM

## 2022-06-28 ENCOUNTER — Encounter: Payer: Self-pay | Admitting: Primary Care

## 2022-06-28 ENCOUNTER — Ambulatory Visit (INDEPENDENT_AMBULATORY_CARE_PROVIDER_SITE_OTHER): Payer: Medicare HMO | Admitting: Primary Care

## 2022-06-28 VITALS — BP 136/82 | HR 100 | Temp 98.7°F | Ht 63.0 in | Wt 180.0 lb

## 2022-06-28 DIAGNOSIS — K2100 Gastro-esophageal reflux disease with esophagitis, without bleeding: Secondary | ICD-10-CM | POA: Diagnosis not present

## 2022-06-28 DIAGNOSIS — Z0000 Encounter for general adult medical examination without abnormal findings: Secondary | ICD-10-CM | POA: Diagnosis not present

## 2022-06-28 DIAGNOSIS — G8929 Other chronic pain: Secondary | ICD-10-CM | POA: Diagnosis not present

## 2022-06-28 DIAGNOSIS — E7800 Pure hypercholesterolemia, unspecified: Secondary | ICD-10-CM | POA: Diagnosis not present

## 2022-06-28 DIAGNOSIS — R7303 Prediabetes: Secondary | ICD-10-CM

## 2022-06-28 DIAGNOSIS — M5441 Lumbago with sciatica, right side: Secondary | ICD-10-CM | POA: Diagnosis not present

## 2022-06-28 DIAGNOSIS — I10 Essential (primary) hypertension: Secondary | ICD-10-CM | POA: Diagnosis not present

## 2022-06-28 DIAGNOSIS — J309 Allergic rhinitis, unspecified: Secondary | ICD-10-CM | POA: Diagnosis not present

## 2022-06-28 DIAGNOSIS — M2550 Pain in unspecified joint: Secondary | ICD-10-CM | POA: Diagnosis not present

## 2022-06-28 LAB — LIPID PANEL
Cholesterol: 167 mg/dL (ref 0–200)
HDL: 61.8 mg/dL (ref >39.00)
LDL Cholesterol: 82 mg/dL (ref 0–99)
NonHDL: 105.61
Total CHOL/HDL Ratio: 3
Triglycerides: 118 mg/dL (ref 0.0–149.0)
VLDL: 23.6 mg/dL (ref 0.0–40.0)

## 2022-06-28 LAB — COMPREHENSIVE METABOLIC PANEL
ALT: 10 U/L (ref 0–35)
AST: 16 U/L (ref 0–37)
Albumin: 3.7 g/dL (ref 3.5–5.2)
Alkaline Phosphatase: 65 U/L (ref 39–117)
BUN: 19 mg/dL (ref 6–23)
CO2: 31 mEq/L (ref 19–32)
Calcium: 9.4 mg/dL (ref 8.4–10.5)
Chloride: 99 mEq/L (ref 96–112)
Creatinine, Ser: 0.74 mg/dL (ref 0.40–1.20)
GFR: 81.56 mL/min (ref >60.00)
Glucose, Bld: 97 mg/dL (ref 70–99)
Potassium: 3.6 mEq/L (ref 3.5–5.1)
Sodium: 138 mEq/L (ref 135–145)
Total Bilirubin: 0.4 mg/dL (ref 0.2–1.2)
Total Protein: 6.9 g/dL (ref 6.0–8.3)

## 2022-06-28 LAB — HEMOGLOBIN A1C: Hgb A1c MFr Bld: 5.5 % (ref 4.6–6.5)

## 2022-06-28 NOTE — Assessment & Plan Note (Signed)
Intermittent.   No concerns today. Continue to monitor.

## 2022-06-28 NOTE — Assessment & Plan Note (Signed)
Immunizations UTD Mammogram and bone density due, scheduled. Colon cancer screening UTD, due 2026  Discussed the importance of a healthy diet and regular exercise in order for weight loss, and to reduce the risk of further co-morbidity.  Exam stable. Labs pending.  Follow up in 1 year for repeat physical.

## 2022-06-28 NOTE — Assessment & Plan Note (Signed)
Controlled. Continue amlodipine 5mg daily.

## 2022-06-28 NOTE — Assessment & Plan Note (Signed)
Repeat A1C pending.  Discussed the importance of a healthy diet and regular exercise in order for weight loss, and to reduce the risk of further co-morbidity.  

## 2022-06-28 NOTE — Assessment & Plan Note (Addendum)
Chronic and progressing.  Discussed to increase Meloxicam 7.5 mg to twice daily. She will update.   Repeat renal function pending.

## 2022-06-28 NOTE — Assessment & Plan Note (Signed)
Overall controlled.  Continue Xyzal 5 mg and Singulair 10 mg daily.

## 2022-06-28 NOTE — Patient Instructions (Signed)
Stop by the lab prior to leaving today. I will notify you of your results once received.   Increase your Meloxicam to twice daily for arthritis.  It was a pleasure to see you today!

## 2022-06-28 NOTE — Assessment & Plan Note (Signed)
Chronic.  Discussed to increase Meloxicam 7.5 mg twice daily.  She will update.

## 2022-06-28 NOTE — Progress Notes (Signed)
Subjective:    Patient ID: Laurel Dimmer, female    DOB: 28-Nov-1950, 72 y.o.   MRN: [id number]  HPI  Jordanna Laurann Mcmillin is a very pleasant 72 y.o. female who presents today for complete physical and follow up of chronic conditions.  Immunizations: -Tetanus: Completed in 2021 -Shingles: Completed Shingrix series -Pneumonia: Completed Prevnar 13 in 2021 and pneumovax in 2020  Diet: Fair diet.  Exercise: No regular exercise.  Eye exam: Completes annually  Dental exam: Completes semi-annually    Mammogram: Scheduled for October 2024 Bone Density Scan: Scheduled for October 2024  Colonoscopy: Completed Cologuard in 2023, negative  BP Readings from Last 3 Encounters:  06/28/22 136/82  11/13/21 (!) 127/93  08/02/21 110/74         Review of Systems  Constitutional:  Negative for unexpected weight change.  HENT:  Negative for rhinorrhea.   Respiratory:  Negative for cough and shortness of breath.   Cardiovascular:  Negative for chest pain.  Gastrointestinal:  Negative for constipation and diarrhea.  Genitourinary:  Negative for difficulty urinating.  Musculoskeletal:  Positive for arthralgias.  Skin:  Negative for rash.  Allergic/Immunologic: Positive for environmental allergies.  Neurological:  Negative for dizziness, numbness and headaches.  Psychiatric/Behavioral:  The patient is not nervous/anxious.          Past Medical History:  Diagnosis Date   Allergic rhinitis    GERD (gastroesophageal reflux disease)    Gout    Hyperlipidemia    Hypertension    Osteoarthritis    Viral gastroenteritis 08/02/2021    Social History   Socioeconomic History   Marital status: Single    Spouse name: Not on file   Number of children: 2   Years of education: Not on file   Highest education level: Not on file  Occupational History   Occupation: Print production planner  Tobacco Use   Smoking status: Never   Smokeless tobacco: Never  Vaping Use   Vaping Use: Never used   Substance and Sexual Activity   Alcohol use: Not Currently    Comment: rarely   Drug use: No   Sexual activity: Not on file  Other Topics Concern   Not on file  Social History Narrative   Divorced   Print production planner   Two children, daughter and son   Social Determinants of Health   Financial Resource Strain: Low Risk  (03/14/2022)   Overall Financial Resource Strain (CARDIA)    Difficulty of Paying Living Expenses: Not very hard  Food Insecurity: No Food Insecurity (03/14/2022)   Hunger Vital Sign    Worried About Running Out of Food in the Last Year: Never true    Ran Out of Food in the Last Year: Never true  Transportation Needs: No Transportation Needs (03/14/2022)   PRAPARE - Administrator, Civil Service (Medical): No    Lack of Transportation (Non-Medical): No  Physical Activity: Sufficiently Active (03/14/2022)   Exercise Vital Sign    Days of Exercise per Week: 5 days    Minutes of Exercise per Session: 150+ min  Stress: No Stress Concern Present (03/14/2022)   [employer] of Occupational Health - Occupational Stress Questionnaire    Feeling of Stress : Not at all  Social Connections: Unknown (03/14/2022)   Social Connection and Isolation Panel [NHANES]    Frequency of Communication with Friends and Family: More than three times a week    Frequency of Social Gatherings with Friends and Family: More than three  times a week    Attends Religious Services: Not on file    Active Member of Clubs or Organizations: No    Attends Club or Organization Meetings: Never    Marital Status: Divorced  Intimate Partner Violence: Not At Risk (03/14/2022)   Humiliation, Afraid, Rape, and Kick questionnaire    Fear of Current or Ex-Partner: No    Emotionally Abused: No    Physically Abused: No    Sexually Abused: No    Past Surgical History:  Procedure Laterality Date   KNEE SURGERY Right    VAGINAL HYSTERECTOMY     left ovary intact, DUB    Family History  Problem  Relation Age of Onset   Hyperlipidemia Mother    Hypertension Mother    Cancer Mother        breast   Arthritis Mother    Heart disease Father    Emphysema Father    Hypertension Sister    Hypertension Brother    Dementia Maternal Aunt        Alzheimers   Dementia Maternal Aunt        Alzheimers    No Known Allergies  Current Outpatient Medications on File Prior to Visit  Medication Sig Dispense Refill   amLODipine (NORVASC) 5 MG tablet TAKE 1 TABLET BY MOUTH EVERY DAY FOR BLOOD PRESSURE 90 tablet 0   atorvastatin (LIPITOR) 20 MG tablet TAKE 1 TABLET BY MOUTH EVERY DAY FOR CHOLESTEROL 90 tablet 0   levocetirizine (XYZAL) 5 MG tablet TAKE 1 TABLET BY MOUTH EVERY DAY IN THE EVENING FOR ALLERGIES 90 tablet 0   meloxicam (MOBIC) 7.5 MG tablet TAKE 1 TABLET BY MOUTH TWICE A DAY AS NEEDED FOR PAIN 60 tablet 0   montelukast (SINGULAIR) 10 MG tablet TAKE 1 TABLET BY MOUTH ONCE DAILY FOR ALLERGIES 90 tablet 0   No current facility-administered medications on file prior to visit.    BP 136/82   Pulse 100   Temp 98.7 F (37.1 C) (Temporal)   Ht 5\' 3"  (1.6 m)   Wt 180 lb (81.6 kg)   SpO2 98%   BMI 31.89 kg/m  Objective:   Physical Exam HENT:     Right Ear: Tympanic membrane and ear canal normal.     Left Ear: Tympanic membrane and ear canal normal.     Nose: Nose normal.  Eyes:     Conjunctiva/sclera: Conjunctivae normal.     Pupils: Pupils are equal, round, and reactive to light.  Neck:     Thyroid: No thyromegaly.  Cardiovascular:     Rate and Rhythm: Normal rate and regular rhythm.     Heart sounds: No murmur heard. Pulmonary:     Effort: Pulmonary effort is normal.     Breath sounds: Normal breath sounds. No rales.  Abdominal:     General: Bowel sounds are normal.     Palpations: Abdomen is soft.     Tenderness: There is no abdominal tenderness.  Musculoskeletal:        General: Normal range of motion.     Cervical back: Neck supple.  Lymphadenopathy:      Cervical: No cervical adenopathy.  Skin:    General: Skin is warm and dry.     Findings: No rash.  Neurological:     Mental Status: She is alert and oriented to person, place, and time.     Cranial Nerves: No cranial nerve deficit.     Deep Tendon Reflexes: Reflexes are normal and symmetric.  Psychiatric:        Mood and Affect: Mood normal.           Assessment & Plan:  Preventative health care Assessment & Plan: Immunizations UTD Mammogram and bone density due, scheduled. Colon cancer screening UTD, due 2026  Discussed the importance of a healthy diet and regular exercise in order for weight loss, and to reduce the risk of further co-morbidity.  Exam stable. Labs pending.  Follow up in 1 year for repeat physical.    Essential hypertension Assessment & Plan: Controlled.  Continue amlodipine 5 mg daily.   Orders: -     Comprehensive metabolic panel  Allergic rhinitis, unspecified seasonality, unspecified trigger Assessment & Plan: Overall controlled.  Continue Xyzal 5 mg and Singulair 10 mg daily.   Gastroesophageal reflux disease with esophagitis, unspecified whether hemorrhage Assessment & Plan: Intermittent.   No concerns today. Continue to monitor.    Polyarthralgia Assessment & Plan: Chronic and progressing.  Discussed to increase Meloxicam 7.5 mg to twice daily. She will update.   Repeat renal function pending.    HYPERCHOLESTEROLEMIA Assessment & Plan: Repeat lipid panel pending.  Discussed the importance of a healthy diet and regular exercise in order for weight loss, and to reduce the risk of further co-morbidity. Continue atorvastatin 20 mg daily.   Orders: -     Lipid panel -     Comprehensive metabolic panel  Chronic right-sided low back pain with right-sided sciatica Assessment & Plan: Chronic.  Discussed to increase Meloxicam 7.5 mg twice daily.  She will update.    Prediabetes Assessment & Plan: Repeat A1C  pending.  Discussed the importance of a healthy diet and regular exercise in order for weight loss, and to reduce the risk of further co-morbidity.   Orders: -     Hemoglobin A1c        Doreene Nest, NP

## 2022-06-28 NOTE — Assessment & Plan Note (Signed)
Repeat lipid panel pending.  Discussed the importance of a healthy diet and regular exercise in order for weight loss, and to reduce the risk of further co-morbidity. Continue atorvastatin 20 mg daily. 

## 2022-07-22 ENCOUNTER — Other Ambulatory Visit: Payer: Self-pay | Admitting: Primary Care

## 2022-07-22 DIAGNOSIS — J309 Allergic rhinitis, unspecified: Secondary | ICD-10-CM

## 2022-08-28 ENCOUNTER — Other Ambulatory Visit: Payer: Self-pay | Admitting: Primary Care

## 2022-08-28 DIAGNOSIS — I10 Essential (primary) hypertension: Secondary | ICD-10-CM

## 2022-09-03 ENCOUNTER — Other Ambulatory Visit: Payer: Self-pay | Admitting: Primary Care

## 2022-09-03 DIAGNOSIS — E7800 Pure hypercholesterolemia, unspecified: Secondary | ICD-10-CM

## 2022-09-06 ENCOUNTER — Other Ambulatory Visit: Payer: Self-pay | Admitting: Primary Care

## 2022-09-06 DIAGNOSIS — J309 Allergic rhinitis, unspecified: Secondary | ICD-10-CM

## 2022-09-28 IMAGING — DX DG CHEST 2V
2 series · 2 of 2 positions shown · non-contrast
Comparison: Prior chest x-ray 06/07/2016 and 09/01/2000

CLINICAL DATA: Persistent cough

EXAM:
CHEST - 2 VIEW

[chest pa]
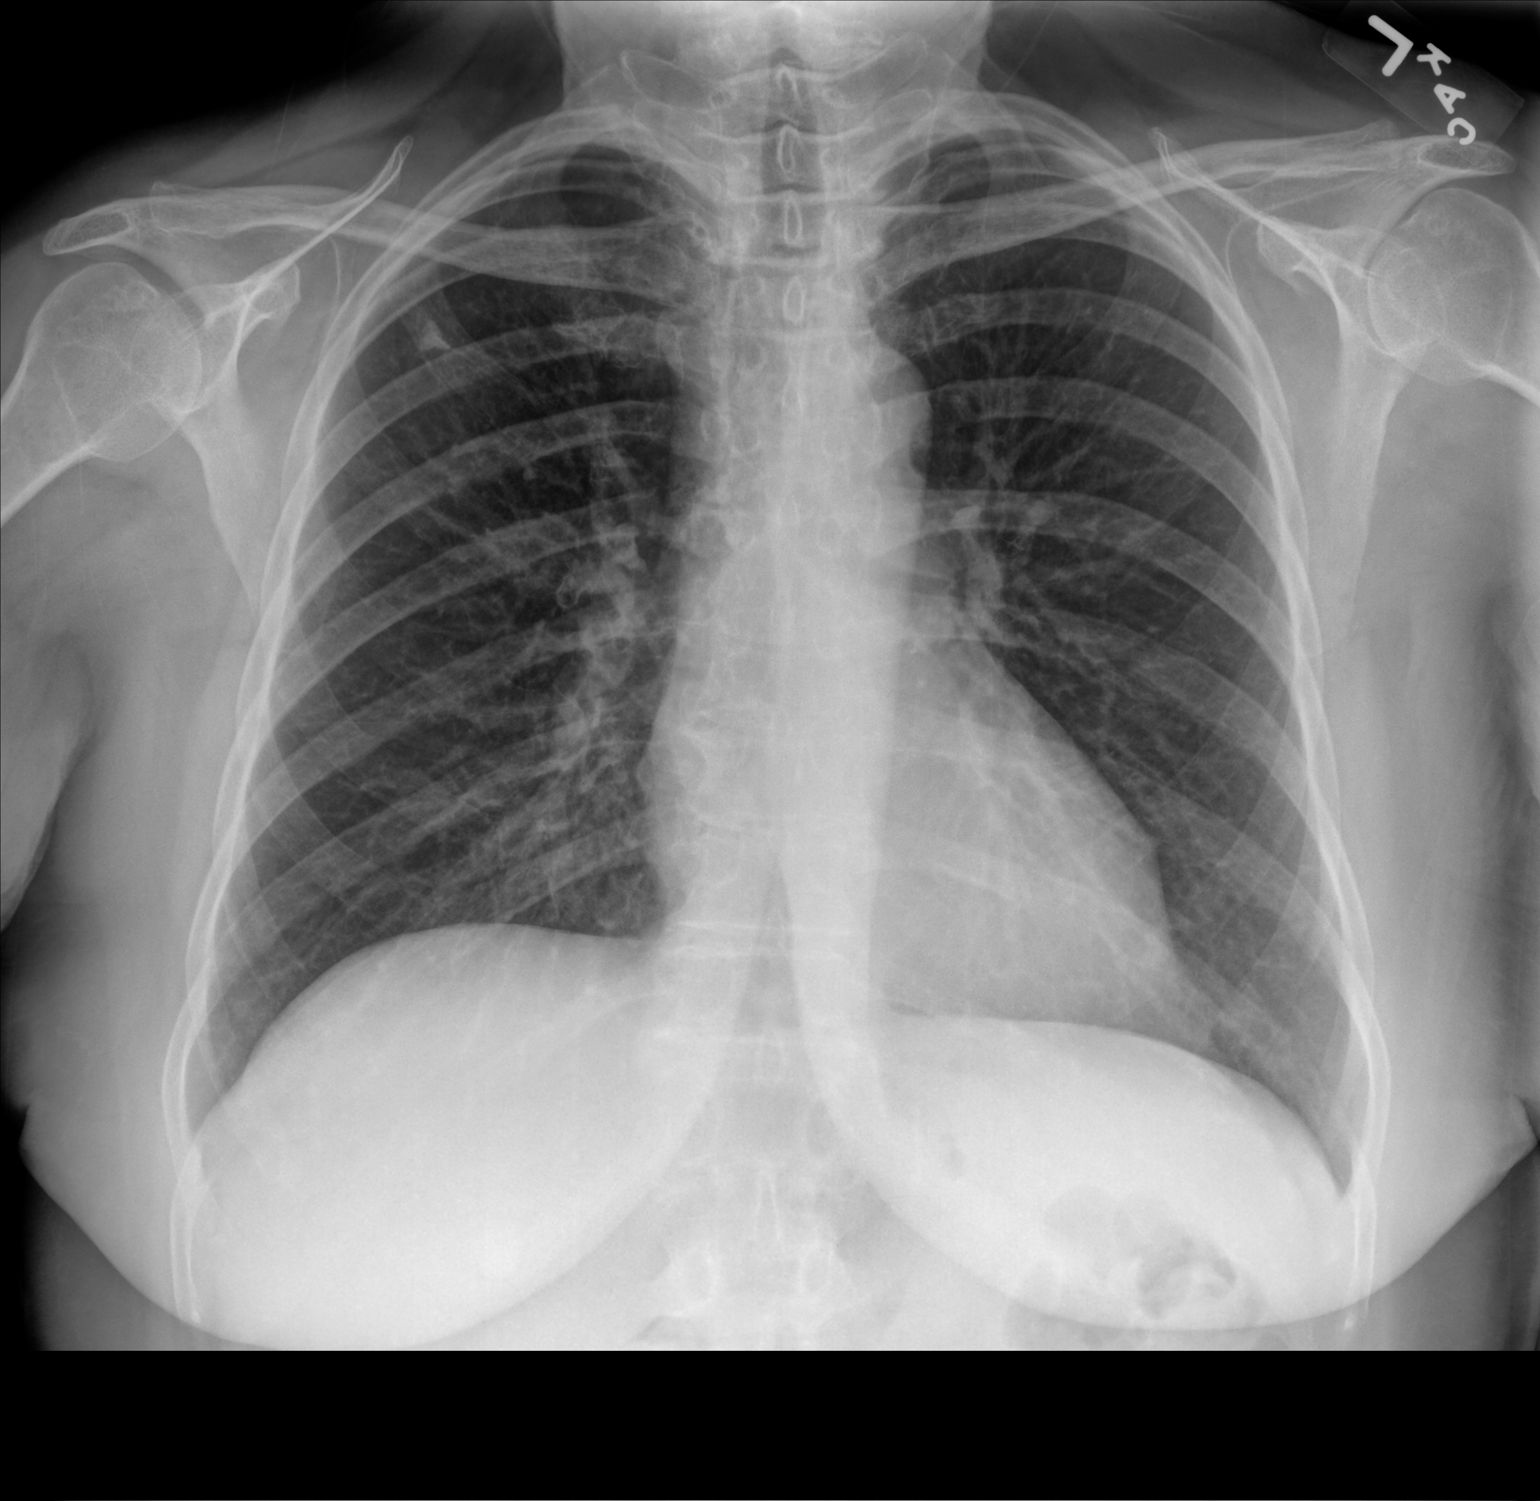

[chest lat]
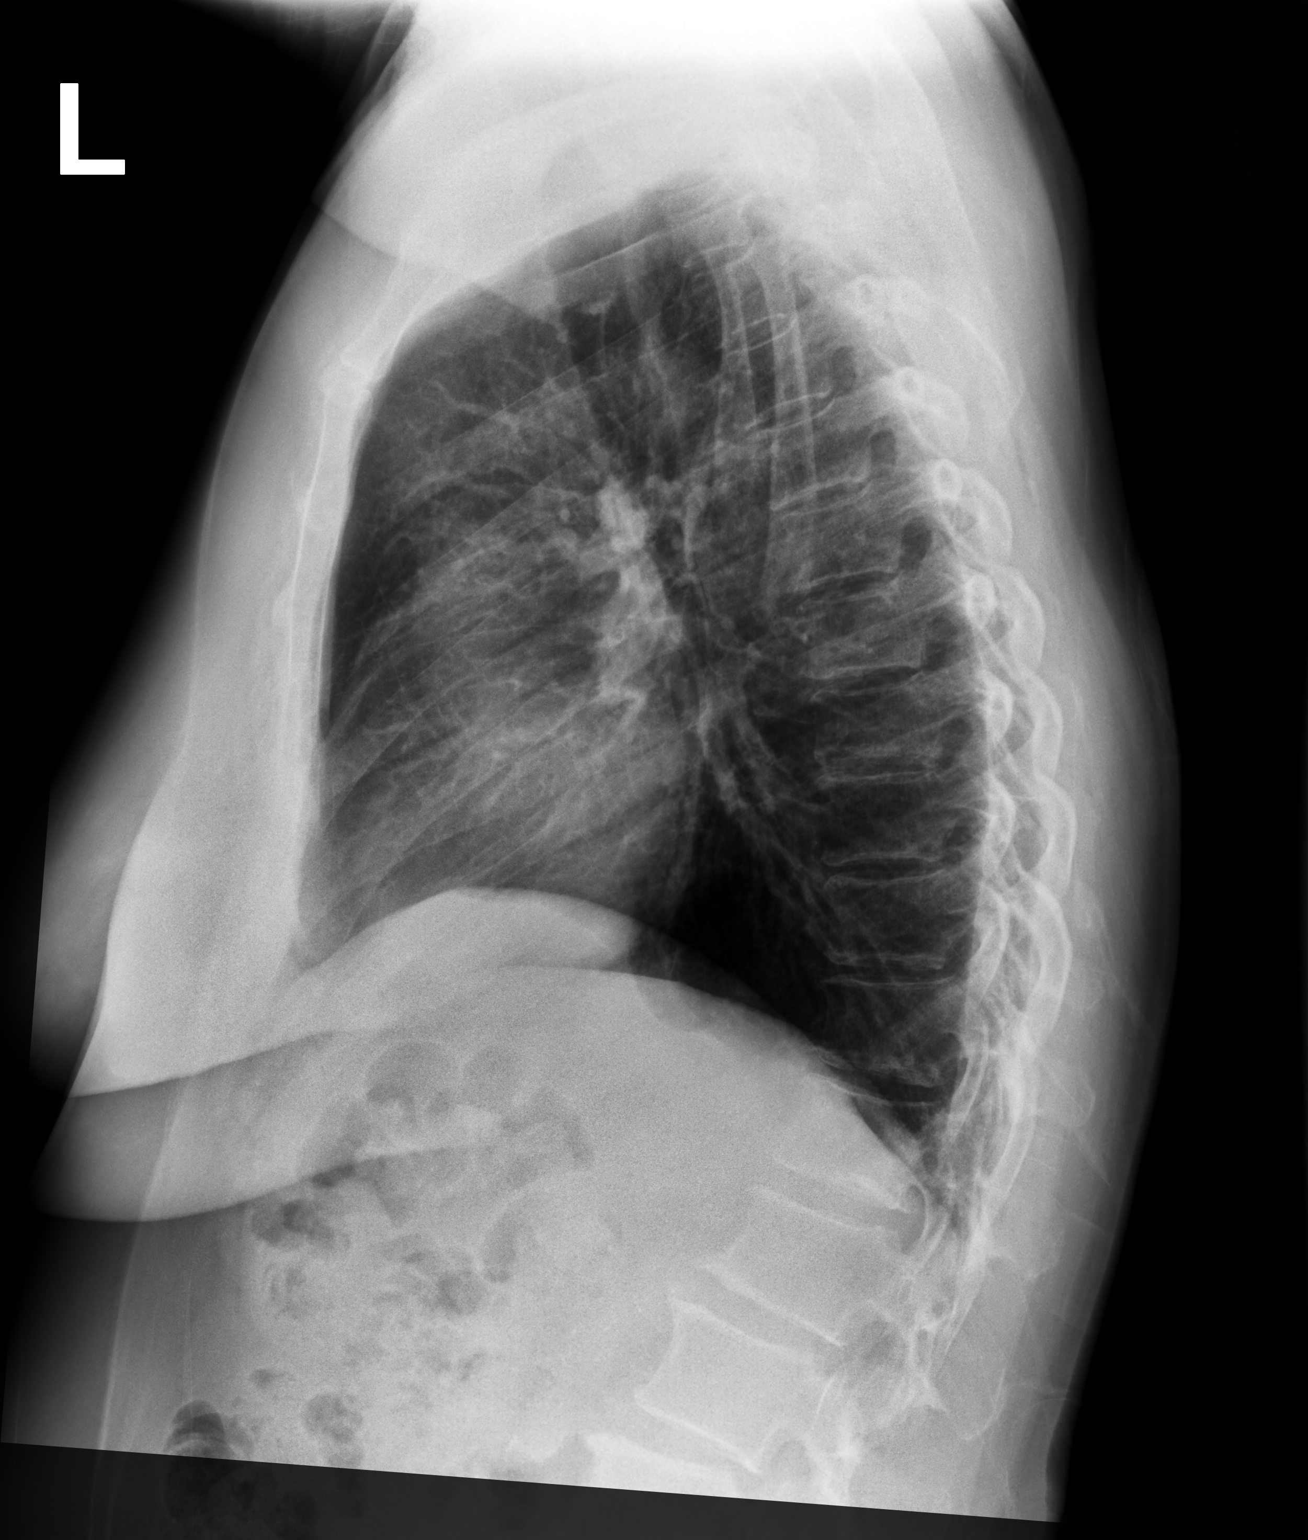

[2 of 2 positions shown; findings below may reference images not displayed]

FINDINGS: The lungs are clear and negative for focal airspace consolidation,
pulmonary edema or suspicious pulmonary nodule. No pleural effusion
or pneumothorax. 7 mm calcification overlies the right upper lung
overlapping with the anterior aspect of the right second rib. This
is been previously described on prior chest x-rays from 9354 in 3223
and is almost certainly benign. No other pulmonary masses or
nodules. Cardiac and mediastinal contours are within normal limits.
No acute fracture or lytic or blastic osseous lesions. The
visualized upper abdominal bowel gas pattern is unremarkable.
IMPRESSION: No active cardiopulmonary disease.

## 2022-10-20 ENCOUNTER — Ambulatory Visit (INDEPENDENT_AMBULATORY_CARE_PROVIDER_SITE_OTHER): Payer: Medicare HMO | Admitting: Internal Medicine

## 2022-10-20 ENCOUNTER — Encounter: Payer: Self-pay | Admitting: Internal Medicine

## 2022-10-20 VITALS — BP 124/70 | HR 89 | Temp 97.7°F | Ht 63.0 in | Wt 189.0 lb

## 2022-10-20 DIAGNOSIS — J069 Acute upper respiratory infection, unspecified: Secondary | ICD-10-CM

## 2022-10-20 NOTE — Assessment & Plan Note (Signed)
Vs allergy symptoms May have some degree of Eustachian tube dysfunction on left Discussed adding a second allergy medication--like loratadine If worsens next week---would try empiric Rx with augmentin

## 2022-10-20 NOTE — Progress Notes (Signed)
Subjective:    Patient ID: Crystal Hardin, female    DOB: [birth year], 72 y.o.   MRN: [id number]  HPI Here due to respiratory symptoms  Started about 4-5 days ago Having runny nose, etc with allergies since before then Then got "swishing" feeling in left ear--not pain. This has persisted Some issues with balance--like when bending down  Bad sinus headache---continues despite the xyzal and montelukast Sinus tylenol also--not helping now  Low grade fever last night No chills but always cold. No sweats No cough  May have some post nasal drip Intermittent sore throat in past week No SOB  Current Outpatient Medications on File Prior to Visit  Medication Sig Dispense Refill   amLODipine (NORVASC) 5 MG tablet TAKE 1 TABLET BY MOUTH EVERY DAY FOR BLOOD PRESSURE 90 tablet 2   atorvastatin (LIPITOR) 20 MG tablet TAKE 1 TABLET BY MOUTH EVERY DAY FOR CHOLESTEROL 90 tablet 2   levocetirizine (XYZAL) 5 MG tablet TAKE 1 TABLET BY MOUTH EVERY DAY IN THE EVENING FOR ALLERGIES 90 tablet 2   meloxicam (MOBIC) 7.5 MG tablet TAKE 1 TABLET BY MOUTH TWICE A DAY AS NEEDED FOR PAIN 60 tablet 0   montelukast (SINGULAIR) 10 MG tablet TAKE 1 TABLET BY MOUTH ONCE DAILY FOR ALLERGIES 90 tablet 2   No current facility-administered medications on file prior to visit.    No Known Allergies  Past Medical History:  Diagnosis Date   Allergic rhinitis    GERD (gastroesophageal reflux disease)    Gout    Hyperlipidemia    Hypertension    Osteoarthritis    Viral gastroenteritis 08/02/2021    Past Surgical History:  Procedure Laterality Date   KNEE SURGERY Right    VAGINAL HYSTERECTOMY     left ovary intact, DUB    Family History  Problem Relation Age of Onset   Hyperlipidemia Mother    Hypertension Mother    Cancer Mother        breast   Arthritis Mother    Heart disease Father    Emphysema Father    Hypertension Sister    Hypertension Brother    Dementia Maternal Aunt        Alzheimers    Dementia Maternal Aunt        Alzheimers    Social History   Socioeconomic History   Marital status: Single    Spouse name: Not on file   Number of children: 2   Years of education: Not on file   Highest education level: Not on file  Occupational History   Occupation: Print production planner  Tobacco Use   Smoking status: Never   Smokeless tobacco: Never  Vaping Use   Vaping status: Never Used  Substance and Sexual Activity   Alcohol use: Not Currently    Comment: rarely   Drug use: No   Sexual activity: Not on file  Other Topics Concern   Not on file  Social History Narrative   Divorced   Print production planner   Two children, daughter and son   Social Determinants of Health   Financial Resource Strain: Low Risk  (03/14/2022)   Overall Financial Resource Strain (CARDIA)    Difficulty of Paying Living Expenses: Not very hard  Food Insecurity: No Food Insecurity (03/14/2022)   Hunger Vital Sign    Worried About Running Out of Food in the Last Year: Never true    Ran Out of Food in the Last Year: Never true  Transportation Needs: No Transportation  Needs (03/14/2022)   PRAPARE - Administrator, Civil Service (Medical): No    Lack of Transportation (Non-Medical): No  Physical Activity: Sufficiently Active (03/14/2022)   Exercise Vital Sign    Days of Exercise per Week: 5 days    Minutes of Exercise per Session: 150+ min  Stress: No Stress Concern Present (03/14/2022)   [employer] of Occupational Health - Occupational Stress Questionnaire    Feeling of Stress : Not at all  Social Connections: Unknown (03/14/2022)   Social Connection and Isolation Panel [NHANES]    Frequency of Communication with Friends and Family: More than three times a week    Frequency of Social Gatherings with Friends and Family: More than three times a week    Attends Religious Services: Not on Insurance claims handler of Clubs or Organizations: No    Attends Banker Meetings: Never     Marital Status: Divorced  Catering manager Violence: Not At Risk (03/14/2022)   Humiliation, Afraid, Rape, and Kick questionnaire    Fear of Current or Ex-Partner: No    Emotionally Abused: No    Physically Abused: No    Sexually Abused: No   Review of Systems No loss of smell or taste Some nausea today--no vomiting Eating okay No recent travel     Objective:   Physical Exam Constitutional:      Appearance: Normal appearance.  HENT:     Head:     Comments: Mild maxillary sensitiivity    Ears:     Comments: Left TM slightly retracted but no inflammation Right seems normal    Mouth/Throat:     Pharynx: No oropharyngeal exudate or posterior oropharyngeal erythema.  Pulmonary:     Effort: Pulmonary effort is normal.     Breath sounds: Normal breath sounds. No wheezing or rales.  Musculoskeletal:     Cervical back: Neck supple.  Lymphadenopathy:     Cervical: No cervical adenopathy.  Neurological:     Mental Status: She is alert.            Assessment & Plan:

## 2022-10-25 ENCOUNTER — Telehealth: Payer: Self-pay | Admitting: Primary Care

## 2022-10-25 MED ORDER — AMOXICILLIN-POT CLAVULANATE 875-125 MG PO TABS: 1.0000 | ORAL_TABLET | Freq: Two times a day (BID) | ORAL | 0 refills | Status: DC

## 2022-10-25 NOTE — Telephone Encounter (Signed)
Spoke to pt

## 2022-10-25 NOTE — Telephone Encounter (Signed)
Patient called in and stated that she seen Dr. Alphonsus Sias and was informed if she wasn't feeling to call back in and he will send in an antibiotic. She stated that she is needing that antibiotic due to still experiencing sinus headache, throat ache, no fever, and drainage. Thank you!

## 2022-10-25 NOTE — Telephone Encounter (Signed)
Please let her know I sent the antibiotic to [facility]

## 2022-11-15 ENCOUNTER — Inpatient Hospital Stay
Admission: RE | Admit: 2022-11-15 | Discharge: 2022-11-15 | Disposition: A | Discharge status: Home / Self Care | Payer: Medicare HMO | Source: Ambulatory Visit | Attending: Primary Care

## 2022-11-15 ENCOUNTER — Ambulatory Visit
Admission: RE | Admit: 2022-11-15 | Discharge: 2022-11-15 | Disposition: A | Discharge status: Home / Self Care | Payer: Medicare HMO | Source: Ambulatory Visit | Attending: Primary Care

## 2022-11-15 DIAGNOSIS — Z1231 Encounter for screening mammogram for malignant neoplasm of breast: Secondary | ICD-10-CM | POA: Diagnosis not present

## 2022-11-15 DIAGNOSIS — E349 Endocrine disorder, unspecified: Secondary | ICD-10-CM | POA: Diagnosis not present

## 2022-11-15 DIAGNOSIS — N958 Other specified menopausal and perimenopausal disorders: Secondary | ICD-10-CM | POA: Diagnosis not present

## 2022-11-15 DIAGNOSIS — E2839 Other primary ovarian failure: Secondary | ICD-10-CM

## 2022-11-15 DIAGNOSIS — M8588 Other specified disorders of bone density and structure, other site: Secondary | ICD-10-CM | POA: Diagnosis not present

## 2022-11-18 ENCOUNTER — Other Ambulatory Visit: Payer: Self-pay | Admitting: Primary Care

## 2022-11-18 DIAGNOSIS — R928 Other abnormal and inconclusive findings on diagnostic imaging of breast: Secondary | ICD-10-CM

## 2022-11-28 ENCOUNTER — Other Ambulatory Visit: Payer: Self-pay | Admitting: Primary Care

## 2022-11-28 DIAGNOSIS — M2550 Pain in unspecified joint: Secondary | ICD-10-CM

## 2022-11-29 MED ORDER — MELOXICAM 7.5 MG PO TABS
7.5000 mg | ORAL_TABLET | Freq: Two times a day (BID) | ORAL | 0 refills | Status: DC | PRN
Start: 2022-11-29 — End: 2023-03-06

## 2022-12-05 ENCOUNTER — Ambulatory Visit
Admission: RE | Admit: 2022-12-05 | Discharge: 2022-12-05 | Disposition: A | Discharge status: Home / Self Care | Payer: Medicare HMO | Source: Ambulatory Visit | Attending: Primary Care | Admitting: Primary Care

## 2022-12-05 ENCOUNTER — Other Ambulatory Visit: Payer: Self-pay | Admitting: Primary Care

## 2022-12-05 DIAGNOSIS — R928 Other abnormal and inconclusive findings on diagnostic imaging of breast: Secondary | ICD-10-CM

## 2022-12-05 DIAGNOSIS — N6489 Other specified disorders of breast: Secondary | ICD-10-CM

## 2023-01-27 DIAGNOSIS — J020 Streptococcal pharyngitis: Secondary | ICD-10-CM | POA: Diagnosis not present

## 2023-01-27 DIAGNOSIS — Z6832 Body mass index (BMI) 32.0-32.9, adult: Secondary | ICD-10-CM | POA: Diagnosis not present

## 2023-03-05 ENCOUNTER — Other Ambulatory Visit: Payer: Self-pay | Admitting: Primary Care

## 2023-03-05 DIAGNOSIS — M2550 Pain in unspecified joint: Secondary | ICD-10-CM

## 2023-03-27 DIAGNOSIS — R112 Nausea with vomiting, unspecified: Secondary | ICD-10-CM | POA: Diagnosis not present

## 2023-03-27 DIAGNOSIS — Z6832 Body mass index (BMI) 32.0-32.9, adult: Secondary | ICD-10-CM | POA: Diagnosis not present

## 2023-03-27 DIAGNOSIS — Z20828 Contact with and (suspected) exposure to other viral communicable diseases: Secondary | ICD-10-CM | POA: Diagnosis not present

## 2023-03-27 DIAGNOSIS — R051 Acute cough: Secondary | ICD-10-CM | POA: Diagnosis not present

## 2023-03-27 DIAGNOSIS — I10 Essential (primary) hypertension: Secondary | ICD-10-CM | POA: Diagnosis not present

## 2023-04-14 ENCOUNTER — Other Ambulatory Visit: Payer: Self-pay | Admitting: Primary Care

## 2023-04-14 DIAGNOSIS — J309 Allergic rhinitis, unspecified: Secondary | ICD-10-CM

## 2023-05-30 ENCOUNTER — Ambulatory Visit (INDEPENDENT_AMBULATORY_CARE_PROVIDER_SITE_OTHER)

## 2023-05-30 VITALS — BP 124/70 | Ht 63.0 in | Wt 176.0 lb

## 2023-05-30 DIAGNOSIS — Z0000 Encounter for general adult medical examination without abnormal findings: Secondary | ICD-10-CM | POA: Diagnosis not present

## 2023-05-30 DIAGNOSIS — Z2821 Immunization not carried out because of patient refusal: Secondary | ICD-10-CM | POA: Diagnosis not present

## 2023-05-30 NOTE — Patient Instructions (Signed)
 Crystal Hardin , Thank you for taking time to come for your Medicare Wellness Visit. I appreciate your ongoing commitment to your health goals. Please review the following plan we discussed and let me know if I can assist you in the future.   Referrals/Orders/Follow-Ups/Clinician Recommendations: Follow up next year as scheduled for your AWV.  This is a list of the screening recommended for you and due dates:  Health Maintenance  Topic Date Due   COVID-19 Vaccine (3 - 2024-25 season) 10/09/2022   Flu Shot  09/08/2023   Cologuard (Stool DNA test)  04/23/2024   Medicare Annual Wellness Visit  05/29/2024   Mammogram  12/04/2024   DTaP/Tdap/Td vaccine (4 - Td or Tdap) 11/10/2029   Pneumonia Vaccine  Completed   DEXA scan (bone density measurement)  Completed   Hepatitis C Screening  Completed   Zoster (Shingles) Vaccine  Completed   HPV Vaccine  Aged Out   Meningitis B Vaccine  Aged Out    Advanced directives: (Declined) Advance directive discussed with you today. Even though you declined this today, please call our office should you change your mind, and we can give you the proper paperwork for you to fill out.  Next Medicare Annual Wellness Visit scheduled for next year: Yes

## 2023-05-30 NOTE — Progress Notes (Signed)
 Because this visit was a virtual/telehealth visit,  certain criteria was not obtained, such a blood pressure, CBG if applicable, and timed get up and go. Any medications not marked as "taking" were not mentioned during the medication reconciliation part of the visit. Any vitals not documented were not able to be obtained due to this being a telehealth visit or patient was unable to self-report a recent blood pressure reading due to a lack of equipment at home via telehealth. Vitals that have been documented are verbally provided by the patient.   This visit was performed by a medical professional under my direct supervision. I was immediately available for consultation/collaboration. I have reviewed and agree with the Annual Wellness Visit documentation.  Subjective:   Crystal Hardin is a 73 y.o. who presents for a Medicare Wellness preventive visit.  Visit Complete: Virtual I connected with  Crystal Hardin on 05/30/23 by a video and audio enabled telemedicine application and verified that I am speaking with the correct person using two identifiers.  Patient Location: Home  Provider Location: Home Office  I discussed the limitations of evaluation and management by telemedicine. The patient expressed understanding and agreed to proceed.  Vital Signs: Because this visit was a virtual/telehealth visit, some criteria may be missing or patient reported. Any vitals not documented were not able to be obtained and vitals that have been documented are patient reported.  Persons Participating in Visit: Patient.  AWV Questionnaire: No: Patient Medicare AWV questionnaire was not completed prior to this visit.  Cardiac Risk Factors include: advanced age (>61men, >46 women);obesity (BMI >30kg/m2);hypertension     Objective:    Today's Vitals   05/30/23 0815  BP: 124/70  Weight: 176 lb (79.8 kg)  Height: 5\' 3"  (1.6 m)   Body mass index is 31.18 kg/m.     05/30/2023    8:14 AM 03/14/2022    10:27 AM 11/13/2021   11:50 AM 03/12/2021   12:01 PM 03/11/2020   12:01 PM 03/04/2019    2:05 PM 02/28/2018    3:54 PM  Advanced Directives  Does Patient Have a Medical Advance Directive? No No No No No No No  Would patient like information on creating a medical advance directive? No - Patient declined No - Patient declined  Yes (MAU/Ambulatory/Procedural Areas - Information given) No - Patient declined Yes (MAU/Ambulatory/Procedural Areas - Information given) No - Patient declined    Current Medications (verified) Outpatient Encounter Medications as of 05/30/2023  Medication Sig   amLODipine  (NORVASC ) 5 MG tablet TAKE 1 TABLET BY MOUTH EVERY DAY FOR BLOOD PRESSURE   atorvastatin  (LIPITOR) 20 MG tablet TAKE 1 TABLET BY MOUTH EVERY DAY FOR CHOLESTEROL   levocetirizine (XYZAL ) 5 MG tablet TAKE 1 TABLET BY MOUTH EVERY DAY IN THE EVENING FOR ALLERGIES   meloxicam  (MOBIC ) 7.5 MG tablet TAKE 1 TABLET (7.5 MG TOTAL) BY MOUTH 2 (TWO) TIMES DAILY AS NEEDED. FOR PAIN   montelukast  (SINGULAIR ) 10 MG tablet TAKE 1 TABLET BY MOUTH ONCE DAILY FOR ALLERGIES   amoxicillin -clavulanate (AUGMENTIN ) 875-125 MG tablet Take 1 tablet by mouth 2 (two) times daily. (Patient not taking: Reported on 05/30/2023)   No facility-administered encounter medications on file as of 05/30/2023.    Allergies (verified) Patient has no known allergies.   History: Past Medical History:  Diagnosis Date   Allergic rhinitis    GERD (gastroesophageal reflux disease)    Gout    Hyperlipidemia    Hypertension    Osteoarthritis  Viral gastroenteritis 08/02/2021   Past Surgical History:  Procedure Laterality Date   KNEE SURGERY Right    VAGINAL HYSTERECTOMY     left ovary intact, DUB   Family History  Problem Relation Age of Onset   Breast cancer Mother    Hyperlipidemia Mother    Hypertension Mother    Cancer Mother        breast   Arthritis Mother    Heart disease Father    Emphysema Father    Hypertension Sister     Dementia Maternal Aunt        Alzheimers   Dementia Maternal Aunt        Alzheimers   Hypertension Brother    Social History   Socioeconomic History   Marital status: Single    Spouse name: Not on file   Number of children: 2   Years of education: Not on file   Highest education level: Not on file  Occupational History   Occupation: Print production planner  Tobacco Use   Smoking status: Never   Smokeless tobacco: Never  Vaping Use   Vaping status: Never Used  Substance and Sexual Activity   Alcohol use: Not Currently    Comment: rarely   Drug use: No   Sexual activity: Not on file  Other Topics Concern   Not on file  Social History Narrative   Divorced   Print production planner   Two children, daughter and son   Social Drivers of Health   Financial Resource Strain: Low Risk  (05/30/2023)   Overall Financial Resource Strain (CARDIA)    Difficulty of Paying Living Expenses: Not very hard  Food Insecurity: No Food Insecurity (05/30/2023)   Hunger Vital Sign    Worried About Running Out of Food in the Last Year: Never true    Ran Out of Food in the Last Year: Never true  Transportation Needs: No Transportation Needs (05/30/2023)   PRAPARE - Administrator, Civil Service (Medical): No    Lack of Transportation (Non-Medical): No  Physical Activity: Sufficiently Active (05/30/2023)   Exercise Vital Sign    Days of Exercise per Week: 5 days    Minutes of Exercise per Session: 150+ min  Stress: No Stress Concern Present (05/30/2023)   [employer] of Occupational Health - Occupational Stress Questionnaire    Feeling of Stress : Not at all  Social Connections: Socially Isolated (05/30/2023)   Social Connection and Isolation Panel [NHANES]    Frequency of Communication with Friends and Family: More than three times a week    Frequency of Social Gatherings with Friends and Family: More than three times a week    Attends Religious Services: Never    Database administrator or  Organizations: No    Attends Engineer, structural: Never    Marital Status: Divorced    Tobacco Counseling Counseling given: Not Answered    Clinical Intake:  Pre-visit preparation completed: Yes  Pain : No/denies pain     BMI - recorded: 31.18 Nutritional Status: BMI > 30  Obese Nutritional Risks: None Diabetes: No  Lab Results  Component Value Date   HGBA1C 5.5 06/28/2022   HGBA1C 5.5 05/20/2021   HGBA1C 5.5 03/18/2020     How often do you need to have someone help you when you read instructions, pamphlets, or other written materials from your doctor or pharmacy?: 1 - Never What is the last grade level you completed in school?: High School  Information entered by :: [employer]   Activities of Daily Living     05/30/2023    8:21 AM  In your present state of health, do you have any difficulty performing the following activities:  Hearing? 0  Vision? 0  Difficulty concentrating or making decisions? 0  Walking or climbing stairs? 1  Comment yes some because her leg pain  Dressing or bathing? 0  Doing errands, shopping? 0  Preparing Food and eating ? N  Using the Toilet? N  In the past six months, have you accidently leaked urine? N  Do you have problems with loss of bowel control? N  Managing your Medications? N  Managing your Finances? N  Housekeeping or managing your Housekeeping? N    Patient Care Team: Gabriel John, NP as PCP - General (Internal Medicine)  Indicate any recent Medical Services you may have received from other than [facility] providers in the past year (date may be approximate).     Assessment:   This is a routine wellness examination for Crystal Hardin.  Hearing/Vision screen Hearing Screening - Comments:: No hearing difficulties Vision Screening - Comments:: Wears glasses   Goals Addressed             This Visit's Progress    Patient Stated       Patient want to stay healthy       Depression Screen      05/30/2023    8:23 AM 06/28/2022    9:20 AM 03/14/2022   10:26 AM 05/20/2021    2:23 PM 03/12/2021   12:04 PM 03/11/2020   12:03 PM 03/04/2019    2:07 PM  PHQ 2/9 Scores  PHQ - 2 Score 2 0 0 0 0 0 0  PHQ- 9 Score 2   0  0 0    Fall Risk     05/30/2023    8:20 AM 06/28/2022    9:20 AM 03/14/2022   10:22 AM 03/14/2022    8:14 AM 03/12/2021   12:03 PM  Fall Risk   Falls in the past year? 1 1 0 0 0  Number falls in past yr: 0 0 0 0 0  Injury with Fall? 0 0  0 0  Risk for fall due to : No Fall Risks;History of fall(s) No Fall Risks   No Fall Risks  Follow up Falls evaluation completed Falls evaluation completed Falls evaluation completed;Falls prevention discussed  Falls prevention discussed    MEDICARE RISK AT HOME:  Medicare Risk at Home Any stairs in or around the home?: Yes If so, are there any without handrails?: No Home free of loose throw rugs in walkways, pet beds, electrical cords, etc?: Yes Adequate lighting in your home to reduce risk of falls?: Yes Life alert?: No Use of a cane, walker or w/c?: No Grab bars in the bathroom?: No Shower chair or bench in shower?: No Elevated toilet seat or a handicapped toilet?: Yes  TIMED UP AND GO:  Was the test performed?  No  Cognitive Function: 6CIT completed    03/11/2020   12:05 PM 03/04/2019    2:09 PM 02/28/2018    3:53 PM  MMSE - Mini Mental State Exam  Not completed: Refused    Orientation to time  5 5  Orientation to Place  5 5  Registration  3 3  Attention/ Calculation  5 0  Recall  3 3  Language- name 2 objects   0  Language- repeat  1 1  Language- follow 3 step command   3  Language- read & follow direction   0  Write a sentence   0  Copy design   0  Total score   20        05/30/2023    8:18 AM 03/14/2022   10:23 AM  6CIT Screen  What Year? 0 points 0 points  What month? 0 points 0 points  What time? 0 points 0 points  Count back from 20 0 points 0 points  Months in reverse 0 points 0 points  Repeat phrase  0 points 0 points  Total Score 0 points 0 points    Immunizations Immunization History  Administered Date(s) Administered   Fluad Quad(high Dose 65+) 03/18/2020   Influenza, High Dose Seasonal PF 11/08/2018   Influenza, Seasonal, Injecte, Preservative Fre 12/08/2015   Influenza,inj,Quad PF,6+ Mos 10/21/2013, 11/04/2014, 03/14/2018   PFIZER(Purple Top)SARS-COV-2 Vaccination 04/07/2019, 04/29/2019   Pneumococcal Conjugate-13 03/18/2019   Pneumococcal Polysaccharide-23 03/14/2018   Td 01/18/2006   Tdap 05/17/2017, 11/11/2019   Zoster Recombinant(Shingrix) 11/08/2018, 05/16/2019    Screening Tests Health Maintenance  Topic Date Due   COVID-19 Vaccine (3 - 2024-25 season) 10/09/2022   INFLUENZA VACCINE  09/08/2023   Fecal DNA (Cologuard)  04/23/2024   Medicare Annual Wellness (AWV)  05/29/2024   MAMMOGRAM  12/04/2024   DTaP/Tdap/Td (4 - Td or Tdap) 11/10/2029   Pneumonia Vaccine 35+ Years old  Completed   DEXA SCAN  Completed   Hepatitis C Screening  Completed   Zoster Vaccines- Shingrix  Completed   HPV VACCINES  Aged Out   Meningococcal B Vaccine  Aged Out    Health Maintenance  Health Maintenance Due  Topic Date Due   COVID-19 Vaccine (3 - 2024-25 season) 10/09/2022   Health Maintenance Items Addressed:patient declined any covid boosters  Additional Screening:  Vision Screening: Recommended annual ophthalmology exams for early detection of glaucoma and other disorders of the eye.  Dental Screening: Recommended annual dental exams for proper oral hygiene  Community Resource Referral / Chronic Care Management: CRR required this visit?  No   CCM required this visit?  No     Plan:     I have personally reviewed and noted the following in the patient's chart:   Medical and social history Use of alcohol, tobacco or illicit drugs  Current medications and supplements including opioid prescriptions. Patient is not currently taking opioid prescriptions. Functional  ability and status Nutritional status Physical activity Advanced directives List of other physicians Hospitalizations, surgeries, and ER visits in previous 12 months Vitals Screenings to include cognitive, depression, and falls Referrals and appointments  In addition, I have reviewed and discussed with patient certain preventive protocols, quality metrics, and best practice recommendations. A written personalized care plan for preventive services as well as general preventive health recommendations were provided to patient.     Freeda Jerry, [place]   05/30/2023   After Visit Summary: (MyChart) Due to this being a telephonic visit, the after visit summary with patients personalized plan was offered to patient via MyChart   Notes: Nothing significant to report at this time.

## 2023-05-31 ENCOUNTER — Other Ambulatory Visit: Payer: Self-pay | Admitting: Primary Care

## 2023-05-31 DIAGNOSIS — I10 Essential (primary) hypertension: Secondary | ICD-10-CM

## 2023-05-31 DIAGNOSIS — J309 Allergic rhinitis, unspecified: Secondary | ICD-10-CM

## 2023-05-31 DIAGNOSIS — E7800 Pure hypercholesterolemia, unspecified: Secondary | ICD-10-CM

## 2023-06-03 ENCOUNTER — Other Ambulatory Visit: Payer: Self-pay | Admitting: Primary Care

## 2023-06-03 DIAGNOSIS — M2550 Pain in unspecified joint: Secondary | ICD-10-CM

## 2023-06-06 ENCOUNTER — Other Ambulatory Visit: Payer: Self-pay | Admitting: Primary Care

## 2023-06-06 DIAGNOSIS — N6489 Other specified disorders of breast: Secondary | ICD-10-CM

## 2023-06-12 ENCOUNTER — Ambulatory Visit
Admission: RE | Admit: 2023-06-12 | Discharge: 2023-06-12 | Disposition: A | Discharge status: Home / Self Care | Payer: Medicare HMO | Source: Ambulatory Visit | Attending: Primary Care | Admitting: Primary Care

## 2023-06-12 ENCOUNTER — Ambulatory Visit

## 2023-06-12 DIAGNOSIS — N6489 Other specified disorders of breast: Secondary | ICD-10-CM

## 2023-06-12 DIAGNOSIS — R928 Other abnormal and inconclusive findings on diagnostic imaging of breast: Secondary | ICD-10-CM | POA: Diagnosis not present

## 2023-06-14 ENCOUNTER — Ambulatory Visit: Payer: Self-pay

## 2023-06-14 NOTE — Telephone Encounter (Signed)
 Noted.

## 2023-06-14 NOTE — Telephone Encounter (Signed)
  Chief Complaint: cough Symptoms: cough, sore throat,  Frequency: 1 week, worsening Pertinent Negatives: Patient denies fever, sob, wheezing Disposition: [] ED /[] Urgent Care (no appt availability in office) / [x] Appointment(In office/virtual)/ []  [facility]/ [] Home Care/ [] Refused Recommended Disposition /[] [facility]/ []  Follow-up with PCP Additional Notes: Patient reports she has been experiencing cough and sore throat x 1 week and feels like it is worsening. Patient has been taking OTC medication with no relief. Per protocol, appt scheduled next available 06/14/23. Patient advised to call back with worsening symptoms. Patient verbalized understanding.   Copied from CRM [id number]. Topic: Clinical - Red Word Triage >> Jun 14, 2023 11:23 AM Rosamond Comes wrote: Red Word that prompted transfer to Nurse Triage: patient calling in coughing for over a week coughing is getting worse, mucus is caught half way up and down, hurts in chest when coughing, sore throat Reason for Disposition  SEVERE coughing spells (e.g., whooping sound after coughing, vomiting after coughing)  Answer Assessment - Initial Assessment Questions 1. ONSET: "When did the cough begin?"      1 week, worsening 2. SEVERITY: "How bad is the cough today?"      severe 3. SPUTUM: "Describe the color of your sputum" (none, dry cough; clear, white, yellow, green)     denies 4. HEMOPTYSIS: "Are you coughing up any blood?" If so ask: "How much?" (flecks, streaks, tablespoons, etc.)     denies 5. DIFFICULTY BREATHING: "Are you having difficulty breathing?" If Yes, ask: "How bad is it?" (e.g., mild, moderate, severe)    - MILD: No SOB at rest, mild SOB with walking, speaks normally in sentences, can lie down, no retractions, pulse < 100.    - MODERATE: SOB at rest, SOB with minimal exertion and prefers to sit, cannot lie down flat, speaks in phrases, mild retractions, audible wheezing, pulse 100-120.    - SEVERE: Very  SOB at rest, speaks in single words, struggling to breathe, sitting hunched forward, retractions, pulse > 120      denies 6. FEVER: "Do you have a fever?" If Yes, ask: "What is your temperature, how was it measured, and when did it start?"     denies 7. CARDIAC HISTORY: "Do you have any history of heart disease?" (e.g., heart attack, congestive heart failure)      HTN 8. LUNG HISTORY: "Do you have any history of lung disease?"  (e.g., pulmonary embolus, asthma, emphysema)     denies 9. PE RISK FACTORS: "Do you have a history of blood clots?" (or: recent major surgery, recent prolonged travel, bedridden)     denies 10. OTHER SYMPTOMS: "Do you have any other symptoms?" (e.g., runny nose, wheezing, chest pain)       Sore throat,  Protocols used: Cough - Acute Non-Productive-A-AH

## 2023-06-15 ENCOUNTER — Encounter: Payer: Self-pay | Admitting: Internal Medicine

## 2023-06-15 ENCOUNTER — Ambulatory Visit (INDEPENDENT_AMBULATORY_CARE_PROVIDER_SITE_OTHER): Admitting: Internal Medicine

## 2023-06-15 VITALS — BP 130/70 | HR 81 | Temp 98.4°F | Ht 63.0 in | Wt 185.0 lb

## 2023-06-15 DIAGNOSIS — J0140 Acute pansinusitis, unspecified: Secondary | ICD-10-CM | POA: Diagnosis not present

## 2023-06-15 MED ORDER — AMOXICILLIN-POT CLAVULANATE 875-125 MG PO TABS: 1.0000 | ORAL_TABLET | Freq: Two times a day (BID) | ORAL | 0 refills | Status: DC

## 2023-06-15 MED ORDER — BENZONATATE 200 MG PO CAPS: 200.0000 mg | ORAL_CAPSULE | Freq: Three times a day (TID) | ORAL | 0 refills | Status: DC | PRN

## 2023-06-15 NOTE — Progress Notes (Signed)
 Subjective:    Patient ID: Crystal Hardin, female    DOB: [birth year], 73 y.o.   MRN: [id number]  HPI Here due to respiratory infection  Started about a week ago---runny nose, like a cold Tried tylenol  cold meds Headache (frontal), sore throat (which went away quickly) Nose dried up but now with worsening cough Keeping her up at night Seems stuck in throat--mucus No ear pain No fever, chills or shakes No SOB other than with cough---chest feels sore from cough  Delsym and benzonatate ---help some  Current Outpatient Medications on File Prior to Visit  Medication Sig Dispense Refill   amLODipine  (NORVASC ) 5 MG tablet TAKE 1 TABLET BY MOUTH EVERY DAY FOR BLOOD PRESSURE 90 tablet 0   atorvastatin  (LIPITOR) 20 MG tablet TAKE 1 TABLET BY MOUTH EVERY DAY FOR CHOLESTEROL 90 tablet 0   levocetirizine (XYZAL ) 5 MG tablet TAKE 1 TABLET BY MOUTH EVERY DAY IN THE EVENING FOR ALLERGIES 90 tablet 0   meloxicam  (MOBIC ) 7.5 MG tablet TAKE 1 TABLET (7.5 MG TOTAL) BY MOUTH 2 (TWO) TIMES DAILY AS NEEDED FOR PAIN 180 tablet 0   montelukast  (SINGULAIR ) 10 MG tablet TAKE 1 TABLET BY MOUTH ONCE DAILY FOR ALLERGIES 90 tablet 0   No current facility-administered medications on file prior to visit.    No Known Allergies  Past Medical History:  Diagnosis Date   Allergic rhinitis    GERD (gastroesophageal reflux disease)    Gout    Hyperlipidemia    Hypertension    Osteoarthritis    Viral gastroenteritis 08/02/2021    Past Surgical History:  Procedure Laterality Date   KNEE SURGERY Right    VAGINAL HYSTERECTOMY     left ovary intact, DUB    Family History  Problem Relation Age of Onset   Breast cancer Mother    Hyperlipidemia Mother    Hypertension Mother    Cancer Mother        breast   Arthritis Mother    Heart disease Father    Emphysema Father    Hypertension Sister    Dementia Maternal Aunt        Alzheimers   Dementia Maternal Aunt        Alzheimers   Hypertension Brother      Social History   Socioeconomic History   Marital status: Single    Spouse name: Not on file   Number of children: 2   Years of education: Not on file   Highest education level: Not on file  Occupational History   Occupation: Print production planner  Tobacco Use   Smoking status: Never   Smokeless tobacco: Never  Vaping Use   Vaping status: Never Used  Substance and Sexual Activity   Alcohol use: Not Currently    Comment: rarely   Drug use: No   Sexual activity: Not on file  Other Topics Concern   Not on file  Social History Narrative   Divorced   Print production planner   Two children, daughter and son   Social Drivers of Health   Financial Resource Strain: Low Risk  (05/30/2023)   Overall Financial Resource Strain (CARDIA)    Difficulty of Paying Living Expenses: Not very hard  Food Insecurity: No Food Insecurity (05/30/2023)   Hunger Vital Sign    Worried About Running Out of Food in the Last Year: Never true    Ran Out of Food in the Last Year: Never true  Transportation Needs: No Transportation Needs (05/30/2023)   PRAPARE -  Administrator, Civil Service (Medical): No    Lack of Transportation (Non-Medical): No  Physical Activity: Sufficiently Active (05/30/2023)   Exercise Vital Sign    Days of Exercise per Week: 5 days    Minutes of Exercise per Session: 150+ min  Stress: No Stress Concern Present (05/30/2023)   [employer] of Occupational Health - Occupational Stress Questionnaire    Feeling of Stress : Not at all  Social Connections: Socially Isolated (05/30/2023)   Social Connection and Isolation Panel [NHANES]    Frequency of Communication with Friends and Family: More than three times a week    Frequency of Social Gatherings with Friends and Family: More than three times a week    Attends Religious Services: Never    Database administrator or Organizations: No    Attends Banker Meetings: Never    Marital Status: Divorced  Catering manager  Violence: Not At Risk (05/30/2023)   Humiliation, Afraid, Rape, and Kick questionnaire    Fear of Current or Ex-Partner: No    Emotionally Abused: No    Physically Abused: No    Sexually Abused: No   Review of Systems No N/V Appetite is off--but able to eat     Objective:   Physical Exam Constitutional:      Appearance: Normal appearance.  HENT:     Head:     Comments: Mild sinus tenderness    Right Ear: Tympanic membrane and ear canal normal.     Left Ear: Tympanic membrane and ear canal normal.     Mouth/Throat:     Pharynx: No oropharyngeal exudate or posterior oropharyngeal erythema.  Pulmonary:     Effort: Pulmonary effort is normal.     Breath sounds: No wheezing or rales.     Comments: Slight I/E rhonchi Musculoskeletal:     Cervical back: Neck supple.  Lymphadenopathy:     Cervical: No cervical adenopathy.  Neurological:     Mental Status: She is alert.            Assessment & Plan:

## 2023-06-15 NOTE — Assessment & Plan Note (Signed)
 May also have some bronchial involvement Will treat with augmentin  875 bid Refill benzonatate  May need CXR if doesn't improve within 48 hours (ours is down today)

## 2023-06-19 ENCOUNTER — Other Ambulatory Visit: Payer: Self-pay | Admitting: Primary Care

## 2023-06-19 DIAGNOSIS — N6489 Other specified disorders of breast: Secondary | ICD-10-CM

## 2023-06-20 ENCOUNTER — Ambulatory Visit: Payer: Self-pay

## 2023-06-20 NOTE — Telephone Encounter (Signed)
 Noted.  Please call patient:  Have her stop the Augmentin  antibiotic.  If her cough symptoms return/worsen then please have her set up visit with us  when she is back in town.  If the diarrhea does not stop after taking the antibiotic medicine then she does need evaluation as well.   She needs to work on hydration with water and low calorie electrolyte drinks such as Gatorade/Pedialyte.

## 2023-06-20 NOTE — Telephone Encounter (Signed)
 Copied from CRM [id number]. Topic: Clinical - Red Word Triage >> Jun 20, 2023  8:40 AM Clydene Darner H wrote: Red Word that prompted transfer to Nurse Triage: Patient called stating she is currently on vacation and experiencing uncontrolled liquid diarrhea that began yesterday afternoon. She was last seen on 05/08 and was prescribed antibiotics during that visit. She reports going nonstop to the restroom for the past 12 hours and is unable to keep water down.The patient is concerned this may be a side effect of the antibiotics and is requesting if something can be prescribed to help manage the symptoms.   Chief Complaint: Diarrhea Symptoms: diarrhea Frequency: 1 day Pertinent Negatives: Patient denies fever, blood in stools, vomiting Disposition: [] ED /[] Urgent Care (no appt availability in office) / [] Appointment(In office/virtual)/ []  [facility]/ [] Home Care/ [] Refused Recommended Disposition /[] [place] [facility] [facility]/ []  Follow-up with PCP Additional Notes: Patient called and advised that she was just seen 06/15/2023 and prescribed Augmentin . Patient has 9 pills left.  Patient states that the cough is getting better but she has been experiencing diarrhea since yesterday. Patient states she is out of town at this time. Patient states that they won't be back in town until Thursday (2 days from now). Patient was wondering about taking an over the counter anti-diarrhea medication if her PCP had any suggestions. She states that she has had about 8 total diarrhea episodes in the past 24 hours but has only had one episode today earlier this morning. She states that she is able to take in fluids and denies any vomiting. She also denies any abdominal pain, blood in her stools, or fevers. She denies feeling dehydrated at this time. Patient states that she is going to stop taking the antibiotic due to the side effects she believes she is experiencing from it. She is advised that at this time the  recommendation is to be seen and evaluated in the next 4 hours due to her symptoms. With patient being out of town at this time, she is advised that Urgent Care is an option and also if she gets any worse for her to go to an Emergency Room. Patient is also given Care Advice as per protocol. Patient verbalized understanding.    Reason for Disposition  SEVERE diarrhea (e.g., 7 or more times / day more than normal)  Answer Assessment - Initial Assessment Questions 1. ANTIBIOTIC: "What antibiotic are you taking?" "How many times per day?"     Amoxicillin     twice a day 2. ANTIBIOTIC ONSET: "When was the antibiotic started?"     06/15/2023 3. DIARRHEA SEVERITY: "How bad is the diarrhea?" "How many more stools have you had in the past 24 hours than normal?"    - NO DIARRHEA (SCALE 0)   - MILD (SCALE 1-3): Few loose or mushy BMs; increase of 1-3 stools over normal daily number of stools; mild increase in ostomy output.   -  MODERATE (SCALE 4-7): Increase of 4-6 stools daily over normal; moderate increase in ostomy output. * SEVERE (SCALE 8-10; OR 'WORST POSSIBLE'): Increase of 7 or more stools daily over normal; moderate increase in ostomy output; incontinence.     10 4. ONSET: "When did the diarrhea begin?"      Yesterday about 3pm 5. BM CONSISTENCY: "How loose or watery is the diarrhea?"      Very watery/"milky" 6. VOMITING: "Are you also vomiting?" If Yes, ask: "How many times in the past 24 hours?"  No 7. ABDOMEN PAIN: "Are you having any abdomen pain?" If Yes, ask: "What does it feel like?" (e.g., crampy, dull, intermittent, constant)      No 8. ABDOMEN PAIN SEVERITY: If present, ask: "How bad is the pain?"  (e.g., Scale 1-10; mild, moderate, or severe)   - MILD (1-3): doesn't interfere with normal activities, abdomen soft and not tender to touch    - MODERATE (4-7): interferes with normal activities or awakens from sleep, abdomen tender to touch    - SEVERE (8-10): excruciating pain,  doubled over, unable to do any normal activities       No 9. ORAL INTAKE: If vomiting, "Have you been able to drink liquids?" "How much liquids have you had in the past 24 hours?"     Patient states she has been drinking water but that's it 10. HYDRATION: "Any signs of dehydration?" (e.g., dry mouth [not just dry lips], too weak to stand, dizziness, new weight loss) "When did you last urinate?"       Patient denies signs of dehydration 11. EXPOSURE: "Have you traveled to a foreign country recently?" "Have you been exposed to anyone with diarrhea?" "Could you have eaten any food that was spoiled?"       No 12. OTHER SYMPTOMS: "Do you have any other symptoms?" (e.g., fever, blood in stool)       No  Protocols used: Diarrhea on Antibiotics-A-AH

## 2023-06-20 NOTE — Telephone Encounter (Signed)
 Called patient and reviewed all information. Patient verbalized understanding. Will call if any further questions.

## 2023-06-26 ENCOUNTER — Ambulatory Visit (INDEPENDENT_AMBULATORY_CARE_PROVIDER_SITE_OTHER)
Admission: RE | Admit: 2023-06-26 | Discharge: 2023-06-26 | Disposition: A | Discharge status: Home / Self Care | Source: Ambulatory Visit | Attending: Internal Medicine | Admitting: Internal Medicine

## 2023-06-26 ENCOUNTER — Ambulatory Visit: Payer: Self-pay | Admitting: Internal Medicine

## 2023-06-26 ENCOUNTER — Ambulatory Visit (INDEPENDENT_AMBULATORY_CARE_PROVIDER_SITE_OTHER): Admitting: Internal Medicine

## 2023-06-26 VITALS — BP 110/70 | HR 104 | Temp 98.6°F | Ht 63.0 in | Wt 179.0 lb

## 2023-06-26 DIAGNOSIS — R053 Chronic cough: Secondary | ICD-10-CM

## 2023-06-26 DIAGNOSIS — R300 Dysuria: Secondary | ICD-10-CM

## 2023-06-26 DIAGNOSIS — R059 Cough, unspecified: Secondary | ICD-10-CM | POA: Diagnosis not present

## 2023-06-26 LAB — POC URINALSYSI DIPSTICK (AUTOMATED)
Glucose, UA: NEGATIVE
Ketones, UA: NEGATIVE
Nitrite, UA: NEGATIVE
Protein, UA: POSITIVE — AB
Spec Grav, UA: 1.025 (ref 1.010–1.025)
Urobilinogen, UA: 0.2 U/dL
pH, UA: 6 (ref 5.0–8.0)

## 2023-06-26 MED ORDER — FLUCONAZOLE 150 MG PO TABS: 150.0000 mg | ORAL_TABLET | ORAL | 1 refills | Status: AC

## 2023-06-26 MED ORDER — AZITHROMYCIN 250 MG PO TABS: ORAL_TABLET | ORAL | 0 refills | Status: DC

## 2023-06-26 MED ORDER — HYDROCODONE BIT-HOMATROP MBR 5-1.5 MG/5ML PO SOLN: 5.0000 mL | Freq: Every evening | ORAL | 0 refills | Status: DC | PRN

## 2023-06-26 NOTE — Assessment & Plan Note (Addendum)
 Had partial response to augmentin --then diarrhea and had to stop Ongoing sinus symptoms and cough Will check CXR===no apparent pneumonia  Will treat with azithromycin  Hycodan for cough May have had slight vaginal yeast--will give fluconazole  as well

## 2023-06-26 NOTE — Addendum Note (Signed)
 Addended by: Franne Ivory on: 06/26/2023 02:58 PM   Modules accepted: Orders

## 2023-06-26 NOTE — Progress Notes (Signed)
 Subjective:    Patient ID: Crystal Hardin, female    DOB: [birth year], 73 y.o.   MRN: [id number]  HPI Here due to ongoing respiratory symptoms  Went on vacation after our last visit Had seemed better for a while--except for some cough Noted stool "just pouring out"---whitish Not able to eat much Cough is very bad--and having stress incontinence of stool (like milky) Stopped the augmentin  after 5 days after calling in  Stools are now better Having cough  ?fever at night--gets hot and then sweats and flushing No SOB  Still with frontal headache Brief sore throat--related to the bad cough Some ear "tingling"  Current Outpatient Medications on File Prior to Visit  Medication Sig Dispense Refill   amLODipine  (NORVASC ) 5 MG tablet TAKE 1 TABLET BY MOUTH EVERY DAY FOR BLOOD PRESSURE 90 tablet 0   atorvastatin  (LIPITOR) 20 MG tablet TAKE 1 TABLET BY MOUTH EVERY DAY FOR CHOLESTEROL 90 tablet 0   benzonatate  (TESSALON ) 200 MG capsule Take 1 capsule (200 mg total) by mouth 3 (three) times daily as needed for cough. 60 capsule 0   levocetirizine (XYZAL ) 5 MG tablet TAKE 1 TABLET BY MOUTH EVERY DAY IN THE EVENING FOR ALLERGIES 90 tablet 0   meloxicam  (MOBIC ) 7.5 MG tablet TAKE 1 TABLET (7.5 MG TOTAL) BY MOUTH 2 (TWO) TIMES DAILY AS NEEDED FOR PAIN 180 tablet 0   montelukast  (SINGULAIR ) 10 MG tablet TAKE 1 TABLET BY MOUTH ONCE DAILY FOR ALLERGIES 90 tablet 0   No current facility-administered medications on file prior to visit.    No Known Allergies  Past Medical History:  Diagnosis Date   Allergic rhinitis    GERD (gastroesophageal reflux disease)    Gout    Hyperlipidemia    Hypertension    Osteoarthritis    Viral gastroenteritis 08/02/2021    Past Surgical History:  Procedure Laterality Date   KNEE SURGERY Right    VAGINAL HYSTERECTOMY     left ovary intact, DUB    Family History  Problem Relation Age of Onset   Breast cancer Mother    Hyperlipidemia Mother     Hypertension Mother    Cancer Mother        breast   Arthritis Mother    Heart disease Father    Emphysema Father    Hypertension Sister    Dementia Maternal Aunt        Alzheimers   Dementia Maternal Aunt        Alzheimers   Hypertension Brother     Social History   Socioeconomic History   Marital status: Single    Spouse name: Not on file   Number of children: 2   Years of education: Not on file   Highest education level: 12th grade  Occupational History   Occupation: Print production planner  Tobacco Use   Smoking status: Never   Smokeless tobacco: Never  Vaping Use   Vaping status: Never Used  Substance and Sexual Activity   Alcohol use: Not Currently    Comment: rarely   Drug use: No   Sexual activity: Not on file  Other Topics Concern   Not on file  Social History Narrative   Divorced   Print production planner   Two children, daughter and son   Social Drivers of Corporate investment banker Strain: Low Risk  (06/26/2023)   Overall Financial Resource Strain (CARDIA)    Difficulty of Paying Living Expenses: Not very hard  Food Insecurity: Food Insecurity  Present (06/26/2023)   Hunger Vital Sign    Worried About Running Out of Food in the Last Year: Sometimes true    Ran Out of Food in the Last Year: Never true  Transportation Needs: No Transportation Needs (06/26/2023)   PRAPARE - Administrator, Civil Service (Medical): No    Lack of Transportation (Non-Medical): No  Physical Activity: Insufficiently Active (06/26/2023)   Exercise Vital Sign    Days of Exercise per Week: 2 days    Minutes of Exercise per Session: 20 min  Stress: No Stress Concern Present (06/26/2023)   [employer] of Occupational Health - Occupational Stress Questionnaire    Feeling of Stress : Not at all  Social Connections: Moderately Isolated (06/26/2023)   Social Connection and Isolation Panel [NHANES]    Frequency of Communication with Friends and Family: More than three times a week     Frequency of Social Gatherings with Friends and Family: More than three times a week    Attends Religious Services: More than 4 times per year    Active Member of [employer] or Organizations: No    Attends Banker Meetings: Never    Marital Status: Divorced  Catering manager Violence: Not At Risk (05/30/2023)   Humiliation, Afraid, Rape, and Kick questionnaire    Fear of Current or Ex-Partner: No    Emotionally Abused: No    Physically Abused: No    Sexually Abused: No   Review of Systems Tessalon  not helping much No N/V Some urinary symptoms---but used OTC cream that has helped (just had itching and burning when voiding) Thought it might have been a yeast infection    Objective:   Physical Exam Constitutional:      Appearance: Normal appearance.     Comments: Frequent harsh cough  HENT:     Head:     Comments: Mild maxillary tenderness    Right Ear: Tympanic membrane and ear canal normal.     Left Ear: Tympanic membrane and ear canal normal.     Mouth/Throat:     Pharynx: No oropharyngeal exudate or posterior oropharyngeal erythema.  Pulmonary:     Effort: Pulmonary effort is normal.     Breath sounds: Normal breath sounds. No wheezing or rales.  Musculoskeletal:     Cervical back: Neck supple.  Lymphadenopathy:     Cervical: No cervical adenopathy.  Neurological:     Mental Status: She is alert.            Assessment & Plan:

## 2023-06-27 ENCOUNTER — Other Ambulatory Visit: Payer: Self-pay | Admitting: Internal Medicine

## 2023-06-27 DIAGNOSIS — R918 Other nonspecific abnormal finding of lung field: Secondary | ICD-10-CM

## 2023-07-04 ENCOUNTER — Other Ambulatory Visit: Payer: Self-pay | Admitting: Primary Care

## 2023-07-04 ENCOUNTER — Ambulatory Visit (INDEPENDENT_AMBULATORY_CARE_PROVIDER_SITE_OTHER): Payer: Medicare HMO | Admitting: Primary Care

## 2023-07-04 ENCOUNTER — Ambulatory Visit: Payer: Self-pay | Admitting: Primary Care

## 2023-07-04 ENCOUNTER — Encounter: Payer: Self-pay | Admitting: Primary Care

## 2023-07-04 VITALS — BP 126/74 | HR 82 | Temp 97.7°F | Ht 63.0 in | Wt 180.0 lb

## 2023-07-04 DIAGNOSIS — G8929 Other chronic pain: Secondary | ICD-10-CM | POA: Diagnosis not present

## 2023-07-04 DIAGNOSIS — J309 Allergic rhinitis, unspecified: Secondary | ICD-10-CM

## 2023-07-04 DIAGNOSIS — R7989 Other specified abnormal findings of blood chemistry: Secondary | ICD-10-CM

## 2023-07-04 DIAGNOSIS — R7303 Prediabetes: Secondary | ICD-10-CM | POA: Diagnosis not present

## 2023-07-04 DIAGNOSIS — Z0000 Encounter for general adult medical examination without abnormal findings: Secondary | ICD-10-CM

## 2023-07-04 DIAGNOSIS — E7800 Pure hypercholesterolemia, unspecified: Secondary | ICD-10-CM

## 2023-07-04 DIAGNOSIS — M545 Low back pain, unspecified: Secondary | ICD-10-CM | POA: Diagnosis not present

## 2023-07-04 DIAGNOSIS — K2100 Gastro-esophageal reflux disease with esophagitis, without bleeding: Secondary | ICD-10-CM

## 2023-07-04 DIAGNOSIS — I10 Essential (primary) hypertension: Secondary | ICD-10-CM | POA: Diagnosis not present

## 2023-07-04 DIAGNOSIS — M2550 Pain in unspecified joint: Secondary | ICD-10-CM | POA: Diagnosis not present

## 2023-07-04 LAB — COMPREHENSIVE METABOLIC PANEL WITH GFR
ALT: 9 U/L (ref 0–35)
AST: 12 U/L (ref 0–37)
Albumin: 3.8 g/dL (ref 3.5–5.2)
Alkaline Phosphatase: 70 U/L (ref 39–117)
BUN: 17 mg/dL (ref 6–23)
CO2: 33 meq/L — ABNORMAL HIGH (ref 19–32)
Calcium: 9.4 mg/dL (ref 8.4–10.5)
Chloride: 99 meq/L (ref 96–112)
Creatinine, Ser: 0.73 mg/dL (ref 0.40–1.20)
GFR: 82.32 mL/min (ref >60.00)
Glucose, Bld: 109 mg/dL — ABNORMAL HIGH (ref 70–99)
Potassium: 3.7 meq/L (ref 3.5–5.1)
Sodium: 141 meq/L (ref 135–145)
Total Bilirubin: 0.4 mg/dL (ref 0.2–1.2)
Total Protein: 6.9 g/dL (ref 6.0–8.3)

## 2023-07-04 LAB — CBC
HCT: 36.4 % (ref 36.0–46.0)
Hemoglobin: 12.3 g/dL (ref 12.0–15.0)
MCHC: 33.8 g/dL (ref 30.0–36.0)
MCV: 85.5 fl (ref 78.0–100.0)
Platelets: 484 10*3/uL — ABNORMAL HIGH (ref 150.0–400.0)
RBC: 4.26 Mil/uL (ref 3.87–5.11)
RDW: 12.4 % (ref 11.5–15.5)
WBC: 9 10*3/uL (ref 4.0–10.5)

## 2023-07-04 LAB — LIPID PANEL
Cholesterol: 166 mg/dL (ref 0–200)
HDL: 45.3 mg/dL (ref >39.00)
LDL Cholesterol: 91 mg/dL (ref 0–99)
NonHDL: 120.47
Total CHOL/HDL Ratio: 4
Triglycerides: 145 mg/dL (ref 0.0–149.0)
VLDL: 29 mg/dL (ref 0.0–40.0)

## 2023-07-04 LAB — HEMOGLOBIN A1C: Hgb A1c MFr Bld: 5.8 % (ref 4.6–6.5)

## 2023-07-04 NOTE — Patient Instructions (Signed)
 Stop by the lab prior to leaving today. I will notify you of your results once received.   It was a pleasure to see you today!

## 2023-07-04 NOTE — Assessment & Plan Note (Signed)
 Repeat lipid panel pending.

## 2023-07-04 NOTE — Assessment & Plan Note (Signed)
 Controlled.  Continue Singulair  10 mg HS, Xyzal  5 mg daily.

## 2023-07-04 NOTE — Assessment & Plan Note (Signed)
 Ongoing.  Offered xrays, PT, and/or orthopedic referral for which she declines.  Continue Meloxicam  7.5 mg BID.  Renal function pending today

## 2023-07-04 NOTE — Progress Notes (Signed)
 Subjective:    Patient ID: Crystal Hardin, female    DOB: 09-02-1950, 73 y.o.   MRN: [id number]  HPI  Crystal Hardin is a very pleasant 73 y.o. female who presents today for complete physical and follow up of chronic conditions.  She continues to experience lower back pain, right sided. Also with chronic right knee pain, history of arthroscopy to right knee. Known history of osteoarthritis to lumbar spine and right knee. Currently managed on Meloxicam  7.5 mg BID which helps some. Previously followed with orthopedics, no recent visit. She denies numbness to bilateral lower extremities.    Immunizations: -Tetanus: Completed in 2021 -Shingles: Completed Shingrix series -Pneumonia: Completed Prevnar 13 in 2021, Pneumovax 23 in 2020  Diet: Fair diet.  Exercise: No regular exercise.  Eye exam: Completes annually  Dental exam: Completes semi-annually    Mammogram: Completed in May 2024 Bone Density Scan: Completed in October 2024  Colonoscopy: Completed Cologuard in 2023, negative.  Due 2026  BP Readings from Last 3 Encounters:  07/04/23 126/74  06/26/23 110/70  06/15/23 130/70       Review of Systems  Constitutional:  Negative for unexpected weight change.  HENT:  Negative for rhinorrhea.   Respiratory:  Negative for cough and shortness of breath.   Cardiovascular:  Negative for chest pain.  Gastrointestinal:  Negative for constipation and diarrhea.  Genitourinary:  Negative for difficulty urinating.  Musculoskeletal:  Positive for arthralgias and back pain.  Skin:  Negative for rash.  Allergic/Immunologic: Negative for environmental allergies.  Neurological:  Negative for dizziness and headaches.  Psychiatric/Behavioral:  The patient is not nervous/anxious.          Past Medical History:  Diagnosis Date   Allergic rhinitis    GERD (gastroesophageal reflux disease)    Gout    Hyperlipidemia    Hypertension    Osteoarthritis    Strain of lumbar region  12/06/2018   Viral gastroenteritis 08/02/2021    Social History   Socioeconomic History   Marital status: Single    Spouse name: Not on file   Number of children: 2   Years of education: Not on file   Highest education level: 12th grade  Occupational History   Occupation: Print production planner  Tobacco Use   Smoking status: Never   Smokeless tobacco: Never  Vaping Use   Vaping status: Never Used  Substance and Sexual Activity   Alcohol use: Not Currently    Comment: rarely   Drug use: No   Sexual activity: Not on file  Other Topics Concern   Not on file  Social History Narrative   Divorced   Print production planner   Two children, daughter and son   Social Drivers of Health   Financial Resource Strain: Low Risk  (06/26/2023)   Overall Financial Resource Strain (CARDIA)    Difficulty of Paying Living Expenses: Not very hard  Food Insecurity: Food Insecurity Present (06/26/2023)   Hunger Vital Sign    Worried About Running Out of Food in the Last Year: Sometimes true    Ran Out of Food in the Last Year: Never true  Transportation Needs: No Transportation Needs (06/26/2023)   PRAPARE - Administrator, Civil Service (Medical): No    Lack of Transportation (Non-Medical): No  Physical Activity: Insufficiently Active (06/26/2023)   Exercise Vital Sign    Days of Exercise per Week: 2 days    Minutes of Exercise per Session: 20 min  Stress: No Stress Concern  Present (06/26/2023)   [employer] of Occupational Health - Occupational Stress Questionnaire    Feeling of Stress : Not at all  Social Connections: Moderately Isolated (06/26/2023)   Social Connection and Isolation Panel [NHANES]    Frequency of Communication with Friends and Family: More than three times a week    Frequency of Social Gatherings with Friends and Family: More than three times a week    Attends Religious Services: More than 4 times per year    Active Member of [employer] or Organizations: No    Attends Occupational hygienist Meetings: Never    Marital Status: Divorced  Catering manager Violence: Not At Risk (05/30/2023)   Humiliation, Afraid, Rape, and Kick questionnaire    Fear of Current or Ex-Partner: No    Emotionally Abused: No    Physically Abused: No    Sexually Abused: No    Past Surgical History:  Procedure Laterality Date   KNEE SURGERY Right    VAGINAL HYSTERECTOMY     left ovary intact, DUB    Family History  Problem Relation Age of Onset   Breast cancer Mother    Hyperlipidemia Mother    Hypertension Mother    Cancer Mother        breast   Arthritis Mother    Heart disease Father    Emphysema Father    Hypertension Sister    Dementia Maternal Aunt        Alzheimers   Dementia Maternal Aunt        Alzheimers   Hypertension Brother     Allergies  Allergen Reactions   Augmentin  [Amoxicillin -Pot Clavulanate] Diarrhea    Not allergy    Current Outpatient Medications on File Prior to Visit  Medication Sig Dispense Refill   amLODipine  (NORVASC ) 5 MG tablet TAKE 1 TABLET BY MOUTH EVERY DAY FOR BLOOD PRESSURE 90 tablet 0   atorvastatin  (LIPITOR) 20 MG tablet TAKE 1 TABLET BY MOUTH EVERY DAY FOR CHOLESTEROL 90 tablet 0   fluconazole  (DIFLUCAN ) 150 MG tablet Take 1 tablet (150 mg total) by mouth once a week. 2 tablet 1   levocetirizine (XYZAL ) 5 MG tablet TAKE 1 TABLET BY MOUTH EVERY DAY IN THE EVENING FOR ALLERGIES 90 tablet 0   meloxicam  (MOBIC ) 7.5 MG tablet TAKE 1 TABLET (7.5 MG TOTAL) BY MOUTH 2 (TWO) TIMES DAILY AS NEEDED FOR PAIN 180 tablet 0   montelukast  (SINGULAIR ) 10 MG tablet TAKE 1 TABLET BY MOUTH ONCE DAILY FOR ALLERGIES 90 tablet 0   No current facility-administered medications on file prior to visit.    BP 126/74   Pulse 82   Temp 97.7 F (36.5 C) (Temporal)   Ht 5\' 3"  (1.6 m)   Wt 180 lb (81.6 kg)   SpO2 97%   BMI 31.89 kg/m  Objective:   Physical Exam HENT:     Right Ear: Tympanic membrane and ear canal normal.     Left Ear: Tympanic  membrane and ear canal normal.  Eyes:     Pupils: Pupils are equal, round, and reactive to light.  Cardiovascular:     Rate and Rhythm: Normal rate and regular rhythm.  Pulmonary:     Effort: Pulmonary effort is normal.     Breath sounds: Normal breath sounds.  Abdominal:     General: Bowel sounds are normal.     Palpations: Abdomen is soft.     Tenderness: There is no abdominal tenderness.  Musculoskeletal:  General: Normal range of motion.     Cervical back: Neck supple.  Skin:    General: Skin is warm and dry.  Neurological:     Mental Status: She is alert and oriented to person, place, and time.     Cranial Nerves: No cranial nerve deficit.     Deep Tendon Reflexes:     Reflex Scores:      Patellar reflexes are 2+ on the right side and 2+ on the left side. Psychiatric:        Mood and Affect: Mood normal.           Assessment & Plan:  Preventative health care Assessment & Plan: Immunizations UTD. Mammogram and bone density scan UTD Colon cancer screening due 2026  Discussed the importance of a healthy diet and regular exercise in order for weight loss, and to reduce the risk of further co-morbidity.  Exam stable. Labs pending.  Follow up in 1 year for repeat physical.    Essential hypertension Assessment & Plan: Controlled.  Continue amlodipine  5 mg daily.   Orders: -     Comprehensive metabolic panel with GFR -     CBC  Allergic rhinitis, unspecified seasonality, unspecified trigger Assessment & Plan: Controlled.  Continue Singulair  10 mg HS, Xyzal  5 mg daily.    Chronic right-sided low back pain without sciatica Assessment & Plan: Ongoing.  Offered xrays, PT, and/or orthopedic referral for which she declines.  Continue Meloxicam  7.5 mg BID.  Renal function pending today   Gastroesophageal reflux disease with esophagitis, unspecified whether hemorrhage Assessment & Plan: Controlled.  Continue to  monitor.    HYPERCHOLESTEROLEMIA Assessment & Plan: Repeat lipid panel pending.  Orders: -     Lipid panel  Polyarthralgia Assessment & Plan: Ongoing.  Offered further work up today for which she declines. Continue Meloxicam  7.5 mg BID.   Prediabetes Assessment & Plan: Repeat A1C pending.  Orders: -     Hemoglobin A1c        Gabriel John, NP

## 2023-07-04 NOTE — Assessment & Plan Note (Signed)
 Ongoing.  Offered further work up today for which she declines. Continue Meloxicam  7.5 mg BID.

## 2023-07-04 NOTE — Assessment & Plan Note (Signed)
 Repeat A1C pending.

## 2023-07-04 NOTE — Assessment & Plan Note (Signed)
 Controlled.  Continue to monitor.

## 2023-07-04 NOTE — Assessment & Plan Note (Signed)
 Immunizations UTD. Mammogram and bone density scan UTD Colon cancer screening due 2026  Discussed the importance of a healthy diet and regular exercise in order for weight loss, and to reduce the risk of further co-morbidity.  Exam stable. Labs pending.  Follow up in 1 year for repeat physical.

## 2023-07-04 NOTE — Assessment & Plan Note (Signed)
 Controlled. Continue amlodipine 5 mg daily.

## 2023-07-15 ENCOUNTER — Other Ambulatory Visit: Payer: Self-pay | Admitting: Nurse Practitioner

## 2023-07-15 DIAGNOSIS — J309 Allergic rhinitis, unspecified: Secondary | ICD-10-CM

## 2023-07-31 ENCOUNTER — Ambulatory Visit (INDEPENDENT_AMBULATORY_CARE_PROVIDER_SITE_OTHER)
Admission: RE | Admit: 2023-07-31 | Discharge: 2023-07-31 | Disposition: A | Discharge status: Home / Self Care | Source: Ambulatory Visit | Attending: Internal Medicine | Admitting: Internal Medicine

## 2023-07-31 ENCOUNTER — Other Ambulatory Visit

## 2023-07-31 ENCOUNTER — Ambulatory Visit: Payer: Self-pay | Admitting: Internal Medicine

## 2023-07-31 DIAGNOSIS — R918 Other nonspecific abnormal finding of lung field: Secondary | ICD-10-CM | POA: Diagnosis not present

## 2023-08-26 ENCOUNTER — Other Ambulatory Visit: Payer: Self-pay | Admitting: Primary Care

## 2023-08-26 DIAGNOSIS — J309 Allergic rhinitis, unspecified: Secondary | ICD-10-CM

## 2023-08-27 ENCOUNTER — Other Ambulatory Visit: Payer: Self-pay | Admitting: Primary Care

## 2023-08-27 DIAGNOSIS — E7800 Pure hypercholesterolemia, unspecified: Secondary | ICD-10-CM

## 2023-08-27 DIAGNOSIS — I10 Essential (primary) hypertension: Secondary | ICD-10-CM

## 2023-08-31 ENCOUNTER — Other Ambulatory Visit: Payer: Self-pay | Admitting: Primary Care

## 2023-08-31 DIAGNOSIS — M2550 Pain in unspecified joint: Secondary | ICD-10-CM

## 2023-11-29 ENCOUNTER — Other Ambulatory Visit: Payer: Self-pay | Admitting: Primary Care

## 2023-11-29 DIAGNOSIS — M2550 Pain in unspecified joint: Secondary | ICD-10-CM

## 2023-12-25 ENCOUNTER — Ambulatory Visit
Admission: RE | Admit: 2023-12-25 | Discharge: 2023-12-25 | Disposition: A | Source: Ambulatory Visit | Attending: Primary Care

## 2023-12-25 DIAGNOSIS — R928 Other abnormal and inconclusive findings on diagnostic imaging of breast: Secondary | ICD-10-CM | POA: Diagnosis not present

## 2023-12-25 DIAGNOSIS — N6489 Other specified disorders of breast: Secondary | ICD-10-CM

## 2024-01-23 DIAGNOSIS — H524 Presbyopia: Secondary | ICD-10-CM | POA: Diagnosis not present

## 2024-02-26 ENCOUNTER — Other Ambulatory Visit: Payer: Self-pay | Admitting: Primary Care

## 2024-02-26 DIAGNOSIS — M2550 Pain in unspecified joint: Secondary | ICD-10-CM

## 2024-02-26 NOTE — Telephone Encounter (Signed)
Patient is due for CPE/follow up in June, this will be required prior to any further refills.  Please schedule, thank you!   

## 2024-05-30 ENCOUNTER — Ambulatory Visit

## 2024-05-31 ENCOUNTER — Ambulatory Visit
